# Patient Record
Sex: Female | Born: 1945
Health system: Western US, Academic
[De-identification: ages and names within clinical notes are randomized; demographics above are authoritative.]

---

## 2022-11-04 ENCOUNTER — Ambulatory Visit: Payer: Commercial Managed Care - Pharmacy Benefit Manager

## 2022-11-04 ENCOUNTER — Inpatient Hospital Stay: Disposition: A | Discharge status: Home / Self Care | Payer: PRIVATE HEALTH INSURANCE

## 2022-11-04 DIAGNOSIS — G9389 Other specified disorders of brain: Secondary | ICD-10-CM

## 2022-11-04 DIAGNOSIS — C712 Malignant neoplasm of temporal lobe: Secondary | ICD-10-CM

## 2022-11-04 DIAGNOSIS — G935 Compression of brain: Secondary | ICD-10-CM

## 2022-11-04 LAB — Comprehensive Metabolic Panel
ANION GAP: 11 mmol/L (ref 8–19)
BILIRUBIN,TOTAL: 0.4 mg/dL (ref 0.1–1.2)
BILIRUBIN,TOTAL: 0.4 mg/dL (ref 0.1–1.2)

## 2022-11-04 LAB — C-Reactive Protein: C-REACTIVE PROTEIN: <0.3 mg/dL (ref <0.8)

## 2022-11-04 LAB — Blood Lactate: BLOOD LACTATE: 25 mg/dL — ABNORMAL HIGH (ref 5–18)

## 2022-11-04 LAB — Procalcitonin: PROCALCITONIN: <0.1 ug/L (ref <0.10)

## 2022-11-04 LAB — APTT: APTT: <24 s — ABNORMAL LOW (ref 24.4–36.2)

## 2022-11-04 LAB — CBC: RED CELL DISTRIBUTION WIDTH-SD: 39.8 fL (ref 36.9–48.3)

## 2022-11-04 LAB — Free T4: FREE T4: 1.5 ng/dL (ref 0.80–1.70)

## 2022-11-04 LAB — TSH with reflex FT4, FT3: TSH: 0.17 u[IU]/mL — ABNORMAL LOW (ref 0.3–4.7)

## 2022-11-04 LAB — Sedimentation Rate, Erythrocyte: SEDIMENTATION RATE, ERYTHROCYTE: 6 mm/h (ref <=25)

## 2022-11-04 LAB — Prothrombin Time Panel: INR: 1.1 s (ref 11.5–14.4)

## 2022-11-04 LAB — Magnesium: MAGNESIUM: 1.9 meq/L (ref 1.4–1.9)

## 2022-11-04 LAB — Hgb A1c: HGB A1C: 5.7 — ABNORMAL HIGH (ref <5.7)

## 2022-11-04 LAB — Calcium,Ionized: IONIZED CA++,CORRECTED: 1.3 mmol/L — ABNORMAL HIGH (ref 1.09–1.29)

## 2022-11-04 LAB — Phosphorus: PHOSPHORUS: 2.6 mg/dL (ref 2.3–4.4)

## 2022-11-04 MED ORDER — PANTOPRAZOLE SODIUM 40 MG PO TBEC
40 mg | ORAL_TABLET | Freq: Every day | ORAL | 0 refills | 30.00000 days | Status: AC
Start: 2022-11-04 — End: 2022-11-18
  Filled 2022-11-18 (×2): qty 30, 30d supply, fill #0

## 2022-11-04 MED ORDER — LEVETIRACETAM 1000 MG PO TABS
1000 mg | ORAL_TABLET | Freq: Two times a day (BID) | ORAL | 0 refills | 30.00 days | Status: AC
Start: 2022-11-04 — End: 2022-11-18
  Filled 2022-11-18: qty 60, 30d supply, fill #0

## 2022-11-04 MED ORDER — DEXAMETHASONE 4 MG PO TABS
4 mg | ORAL_TABLET | Freq: Three times a day (TID) | ORAL | 0 refills | 30.00000 days | Status: AC
Start: 2022-11-04 — End: 2022-11-19
  Filled 2022-11-18: qty 90, 30d supply, fill #0

## 2022-11-04 MED ADMIN — SODIUM CHLORIDE 0.9 % IV SOLN: 75 mL/h | INTRAVENOUS | @ 11:00:00 | Stop: 2022-12-04 | NDC 00338004904

## 2022-11-04 MED ADMIN — PROPRANOLOL HCL 10 MG PO TABS: 10 mg | ORAL | @ 22:00:00 | Stop: 2022-12-04

## 2022-11-04 MED ADMIN — LEVETIRACETAM 1000 MG/100ML RTU: 1000 mg | INTRAVENOUS | @ 16:00:00 | Stop: 2022-12-04 | NDC 44567050201

## 2022-11-04 MED ADMIN — NICARDIPINE 50MG/250 ML DRIP (PERIPHERAL)(TITRATABLE/ICU): 515 mg/h | INTRAVENOUS | @ 11:00:00 | Stop: 2022-11-05

## 2022-11-04 MED ADMIN — DOCUSATE SODIUM 100 MG PO CAPS: 100 mg | ORAL | @ 16:00:00 | Stop: 2022-11-09 | NDC 00904718361

## 2022-11-04 MED ADMIN — DEXAMETHASONE 4 MG PO TABS: 4 mg | ORAL | @ 16:00:00 | Stop: 2022-11-10 | NDC 66993073051

## 2022-11-04 NOTE — Consults
Pharmaceutical Services - Admission Medication Reconciliation Note      Patient Name: [name]  Medical Record Number: [id number]  Admit date: 11/04/2022 2:45 AM    Age: 77 y.o.  Sex: female  Allergies: No Known Allergies  Height:   Most recent documented height   11/04/22 1.6 m (5' 3'')     Actual Weight:   Most recent documented weight   11/04/22 64.5 kg     Diagnosis: The patient is currently admitted with the following concerns/issues: Principal Problem:    Brain mass (POA: Yes)      Reported Medication History   I spoke with the patient at bedside and used Sures[employer]s (outpatient Rx fill history database) to update the home medication list for this hospital admission.    Patient transferred from OSH [facility], admitted 3.20.2024)    Surescripts pharmacy records show these medications recently filled in the past 3 months but the patient was not taking them prior to this hospital admission:   -Hydrocodone-Acetaminophen 10-325 mg filled 2.28.2024 qty 14 for 7 day supply. Patient confirms not taking at home, doesn't require    Consult question specifically stated ''Some beta blocker reportedly'' - however pt repeatedly denied taking any beta-blockers, and no fills exist in the last 6 months on Surescripts for any beta-blockers.    PTA Medication List (discrepancies are noted)   Medications Prior to Admission   Medication Sig Notes    amLODIPine 10 mg tablet Take 1 tablet (10 mg total) by mouth daily. Currently not ordered inpatient - nicardipine gtt ordered instead      Ascorbic Acid (VITAMIN C) 1000 MG tablet Take 1 tablet (1,000 mg total) by mouth daily. Currently not ordered inpatient      Cholecalciferol (VITAMIN D-3) 125 mcg (5000 units) TABS Take 1 tablet (125 mcg total) by mouth daily. Currently not ordered inpatient      estradiol (ESTRACE) 0.5 mg tablet Take 1 tablet (0.5 mg total) by mouth daily. Currently not ordered inpatient - assess if/when clinically appropriate to restart Glucosamine HCl (GLUCOSAMINE PO) Take 1 capsule by mouth daily. Currently not ordered inpatient      medroxyPROGESTERone 5 mg tablet Take 1 tablet (5 mg total) by mouth daily. Currently not ordered inpatient - assess if/when clinically appropriate to restart      METAMUCIL FIBER PO Mix 1 tablespoon in 8 oz of liquid, and take by mouth daily Currently not ordered inpatient & nonformulary; please reassess clinically      naproxen 250 mg tablet Take 1 tablet (250 mg total) by mouth daily as needed (pain). Filled 2.20.2024 qty 180 for 90 day supply.  Patient confirms 1 tablet every day PRN at home.  Currently not ordered inpatient      omeprazole 20 mg DR capsule Take 1 capsule (20 mg total) by mouth daily. Currently not ordered inpatient & nonformulary; please reassess clinically (may consider pantoprazole)      vitamin E 180 mg (400 units) capsule Take 1 capsule (180 mg total) by mouth daily. Currently not ordered inpatient         Discharge Prescription Preference: No Pharmacies Listed    The patient's allergies and medications have been reviewed and updated.     Lin Landsman, 11/04/2022, 11:25 AM  Medication Reconciliation Pharmacy Technician     ___________________________________________________________________________________________________________________    I reviewed the prior to admission medication list compiled by the medication reconciliation pharmacy technician and attest to the above.     Beers  criteria reviewed with the patient's outpatient medication list.     The reconciliation of admission orders with the prior to admission medication list is complete.     Notified team regarding the updates above via SPOK pager.          Jonette Eva PharmD, BCPS 11/04/2022 11:35 AM   Medication Reconciliation Pharmacist    Please note, accuracy is based on the patient?s/family's/caregiver's ability and willingness to provide this information at the time of interview. This progress note represents our good faith effort to obtain the best possible medication history from all attainable sources at the time of reconciliation.

## 2022-11-04 NOTE — Discharge Summary
NEUROSURGERY DISCHARGE SUMMARY      PATIENT:  [name]   MRN:  [id number]  DOB:  1945/11/08    Attending Physician: Constance Haw., MD  Primary Care Physician:  Aviva Kluver, MD    Admission Date:  11/04/2022  Discharge Date:  11/04/2022    Discharge Diagnosis: Brain mass [G93.89]    Surgery Performed: * No surgery found *    HPI:  [name] is a 77 y.o. female not on AC/AP who presents with 2 weeks of progressive speech difficulty that she describes as 'the words coming out wrong' as well as lethargy. Language problems are equally present when speaking in [place] (first language), [place], and [place]. Denies any memory problems other than what she considers ''normal aging''. Pt reports that she is otherwise in her usual state of health and denies any fevers, chills, seizures, confusion, receptive language difficulty, sensory deficits, or gross motor deficits. Otherwise, she is very active and functional at baseline. 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 She has no known hx of systemic cancers.    Brief Hospital Course: [name] is a 77 y.o. female with Brain mass [G93.89]     3/29: MR wwo c/w R ant temporal tumor, ESR/CRP/procal wnl, lactate elevated, afebrile, no other s/s infxn, low c/f infxn per ICU/neurorads    By day of discharge, the patient had a stable neurologic exam, was breathing easily on room air, had return of bowel function, was tolerating a diet, and had adequate pain control with oral medication. The patient was deemed stable for discharge.      PHYSICAL EXAM:    Vital Signs   Temp:  [36.2 ?C (97.2 ?F)-36.5 ?C (97.7 ?F)] 36.5 ?C (97.7 ?F)  Heart Rate:  [46-60] 59  Resp:  [13-22] 15  BP: (97-150)/(50-85) 140/85  NBP Mean:  [71-101] 101  SpO2:  [93 %-99 %] 96 %  On RA    AAOx3  Following commands x4.  +comprehension, impaired fluency, paraphasic errors, speech latency/extra effort; impaired repetition; naming 2/3; immediate recall 3/3, delayed recall 1/3   CN 2-12 intact  Motor: 5/5 throughout.  No pronator drift.  Reflexes: 2+, symmetric  Sensation grossly intact to light touch    Chest: symmetrical chest rise, CTA in all lobes, NSR, (-)murmur  Vascular: 2+ pulses throughout (-)edema, cyanosis  ABD: soft, nontender, flat, normoactive bowel sounds    Lines and Drains  CENTRAL LINE: None  DRAINS: None  FOLEY: None    Pertinent Imaging  MR OSH:   IMPRESSION:  Heterogeneously enhancing centrally restricting and necrotic mass located in the left anterior mid temporal lobes with moderate perilesional vasogenic edema, regional mass effect and mild midline shift from left to right at the level of septum   pellucidum. 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 Laymond Purser after discharge    Respiratory  No active issues    Cardiovascular  No active issues    GU / Renal  No active issues    ID  No active issues    Endocrine  No active issues    GI / Hepatic  No active  issues  Diet: Regular  GI ppx: pantoprazole 40 qday while on dex    Disposition:   Home    Discharge Condition: Stable    Diet: Regular      Discharge Instructions: Detailed Neurosurgery discharge instructions given and discussed with patient prior to discharge.    Post-Discharge Appointments:   The patient was asked to follow up with neurosurgery for a postoperative visit. Details, including time of postoperative visit, office location, and office phone number was given to the patient directly.          NEUROSURGERY DISCHARGE INSTRUCTIONS:  Please, review the following discharge instructions. They will answer many common questions patients have after surgery.    DIET: You may resume the type of diet you had before surgery, unless changed by your doctor during your stay at the hospital. Eating a well-balanced diet is important for proper wound healing.    MEDICATIONS:     Medication List        START taking these medications      dexAMETHasone 4 mg tablet  Take 1 tablet (4 mg total) by mouth three (3) times daily.     levETIRAcetam 1000 mg tablet  Commonly known as: Keppra  Take 1 tablet (1,000 mg total) by mouth two (2) times daily.     pantoprazole 40 mg DR tablet  Commonly known as: Protonix  Take 1 tablet (40 mg total) by mouth daily.            CONTINUE taking these medications      amLODIPine 10 mg tablet  Commonly known as: Norvasc     estradiol 0.5 mg tablet  Commonly known as: Estrace     GLUCOSAMINE PO     medroxyPROGESTERone 5 mg tablet  Commonly known as: Provera     METAMUCIL FIBER PO     naproxen 250 mg tablet  Commonly known as: Naprosyn     omeprazole 20 mg DR capsule  Commonly known as: PriLOSEC     vitamin C 1000 MG tablet     Vitamin D-3 125 mcg (5000 units) Tabs     vitamin E 180 mg (400 units) capsule               Where to Get Your Medications        These medications were sent to [facility] [phone])  [address], [place] [address]      Hours: Mon-Fri 8AM-9PM, Sat 8AM-7PM, Sun/Holidays 8AM-5PM (Closed 1PM-2PM for lunch) Phone: [phone]   dexAMETHasone 4 mg tablet  levETIRAcetam 1000 mg tablet       Information about where to get these medications is not yet available    Ask your nurse or doctor about these medications  pantoprazole 40 mg DR tablet       Start taking the medications you took prior to surgery unless directed otherwise by your physician. Should you have questions about this please ask your physician prior to discharge.    Your doctor will provide you with prescriptions for the medication you are to take at home. You may fill your prescriptions at the [facility], or you can have them filled at a pharmacy closer to your home.    Before your discharge, your nurse will review with you and write down your medication dosage, schedule, and side effects. It is important to take your medications as ordered and try to stay on schedule.    Do NOT  take ibuprofen, aspirin, coumadin, xarelto or any other anti-platelet or anti-coagulation medication unless instructed to do so by your surgeon.    If you are unsure if a medication is safe, ask your physician and/or pharmacist.    SIGNS TO WATCH FOR AT HOME:  Always try to call your primary care doctor first if you are experiencing any of the symptoms below:  - Onset of severe, persistent headache not relieved by medication and rest.  - Onset of increased drowsiness, confusion, stiff neck, or nausea and vomiting.  - Any new onset or worsening of visual problems.  - Any new onset or worsening of speech problems.  - Any new onset or worsening of swallowing problems.  - Any new onset of or increased weakness, numbness or tingling.  - Any new onset of or increased seizures.  - New weakness or sensory changes  - Fever>101o F  - Shortness of breath  - Chest pain  - Calf/leg pain    For life-threatening emergencies that cannot wait, please go to the nearest Emergency Room for immediate evaluation or dial 911.    HOW TO REACH [place]:  We are available to you at all times.     During business hours, please call [facility]: [phone].     After 5:00 PM, please call the [facility] page operator: [phone]. Ask to have the neurosurgical resident on call contacted for urgent questions.    REHABILITATION NEEDS:  If indicated, our rehabilitation professional will assess you prior to your discharge. We will order any rehabilitation needs and equipment prior to your discharge.    FOLLOW-UP APPOINTMENTS:    Follow up with your primary care physician in 1 week for any health maintenance needs.'  Please follow-up with Dr. Emeline Darling, please call [phone] to schedule your appointment  Darlene Dezelan, your patient navigator, will contact you after you discharge to arrange oncological follow up. She can be reached at [phone] with any questions or concerns.        I spent >30 minutes examining the patient, preparing discharge documents including: discharge records, prescriptions, referral forms, instructions, and communicating discharge plan with the patient, family, and all relevant care providers.    The patient was seen and examined by the Neurosurgery team. Above plan was discussed with neurosurgery resident team and attending physician who were in agreement with the plan.    Author: Rhea Belton, MD  11/04/2022 4:20 PM  Department of Neurosurgery

## 2022-11-04 NOTE — Consults
NEUROSURGERY CONSULT NOTE     CHIEF COMPLAINT: speech difficulty    HISTORY OF PRESENT ILLNESS   [name] is a 77 y.o. year old female not on AC/AP who presents with 2 weeks of progressive speech difficulty that she describes as 'the words coming out wrong' as well as lethargy. Language problems are equally present when speaking in [place] (first language), [place] and [place]. Denies any memory problems other than what she considers normal aging. Pt reports that she is otherwise in her usual state of health and denies any seizures, confusion, receptive language difficulty, sensory deficits, or gross motor deficits. Otherwise, she is very active and functional at baseline. 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 She has no known hx of systemic cancers.    PAST MEDICAL/SURGICAL HISTORY   No past medical history on file.  No past surgical history on file.    MEDICATIONS      dexAMETHasone  4 mg Oral Q8H    docusate  100 mg Oral BID    levETIRAcetam  1,000 mg Intravenous Q12H       ALLERGIES   Patient has no known allergies.    FAMILY HISTORY / SOCIAL HISTORY   No family history on file.   has no history on file for tobacco use, alcohol use, and drug use.    REVIEW OF SYSTEMS   A complete 14-point review of systems was conducted. Pertinent positives and negatives are noted in the HPI. Systems reviewed include constitutional symptoms, eyes, ears, nose, throat, mouth, cardiovascular, respiratory, gastrointestinal, genitourinary, musculoskeletal, integumentary (skin and breasts), neurological, psychiatric, endocrine, hematologic/lymphatic, and allergic/immunologic.     PHYSICAL EXAM   Temp:  [36.4 ?C (97.5 ?F)] 36.4 ?C (97.5 ?F)  Heart Rate:  [55] 55  Resp:  [22] 22  BP: (143)/(74) 143/74  NBP Mean:  [94] 94  SpO2:  [97 %] 97 %  AAOx3  Following commands x4.  +comprehension + some impaired fluency, making paraphasic errors and with some speech latency/extra effort; impaired repetition; naming 2/3; immediate recall 3/3, delayed recall 1/3   Pupils equal, round, and reactive to light. Visual fields full to confrontation, Extraocular eye movements full. Normal facial expression and sensation. Hearing intact bilaterally; Palate elevates equally bilaterally. Sternocleidomastoid and shoulder shrug movements are normal bilaterally. Tongue midline.  Motor: 5/5 throughout.  No pronator drift.  Reflexes: 2+, symmetric  Sensation grossly intact to light touch    DATA     Recent Labs   Lab 11/04/22  0339   WBC 32.12*   PLT 392   INR 1.1   APTT <24.0*   NA 134*   CREAT 0.61   GLUCOSE 126*        MRI from OSH: contrast enhancing anterior temporal lobe mass      ASSESSMENT / PLAN   [name] is a 77 y.o. female  with new ring enhancing high grade lesion in left temporal lobe and associated language difficulty c/f HGG    Plan:   [ ]  fMRI for presurgical planning. Okay for outpatient fMRI.   [ ]  MRI brain lab protocol including DWI/ADC and DTI  - Dexamethasone 4mg  TID  - Keppra for seizure ppx  - ESR / CRP in setting of leukocytosis; infectious workup      The patient was discussed with the resident team & attending who agree with assessment and plan listed above. Please page [phone] with any questions.    PRESENT ON ADMISSION:  Htn  No past medical history on file.    GOC: Patient interested in pursuing all surgical and medical interventions      Jeanette Debes C. Banks Chaikin, MD  6:02 AM 11/04/2022  Department of Neurological Surgery  [phone]

## 2022-11-04 NOTE — H&P
Neurocritical Care Admission Note    The patient was seen in the ICU, is unstable and 30 minutes critical care time was spent by me    The patient is unstable today 11/04/2022 and has a high probability of imminent or life threatening deterioration due to brain mass, brain compression, cerebral edema and requires critical care to treat these medical issues, the inherent instabilility that these issues cause, and to prevent further neurologic and/or systemic deterioration, harm, potential for worsening instability, poor neurologic outcome and/or death.      Patient:[name]  AVW:[id number]  Age:77 y.o.  Length of Stay: 0  Date of Service: 11/04/2022    Present on Admission:   Brain mass      Patient Active Problem List   Diagnosis    Brain mass     No past medical history on file.       Admission History:  77 y.o. female with HTN, chronic back pain who presented to OSH and was found to have L temporal mass c/f glioma, transferred to [facility] for HLOC.   Patient reports word finding difficulty and mild RUE weakness for several weeks, prompting presentation to the ED. Imaging showed 4.9cm L anterior temporal lesion which was concerning for glioma on MRI. Transferred to [facility] for operative planning.   ICU Course:  3/29: transfer to NCCU  Vital Signs & Labs reviewed on EMR Flow Sheets:  Last 24 hours vitals  Temp:  [36.4 ?C (97.5 ?F)] 36.4 ?C (97.5 ?F)  Heart Rate:  [55] 55  Resp:  [22] 22  BP: (143)/(74) 143/74  NBP Mean:  [94] 94  SpO2:  [97 %] 97 %  I & O's Last 24 hours totals: No intake or output data in the 24 hours ending 11/04/22 0340  I&O's Last 24 hours components:No intake/output data recorded.  Labs last 24 hours (labs are drawn just before midnight on the previous calendar day by ICU protocol  :No results found for: ''WBC'', ''HGB'', ''HCT'', ''PLT'', ''NA'', ''K'', ''CL'', ''BUN'', ''CO2''      MEDICATIONS:   docusate  100 mg Oral BID    levETIRAcetam  1,000 mg Intravenous Q12H       Physical Examination:    GCS:15 [E/V/M scores are  4/5/6   ]  General Systems Examination: Systems examined as clinically indicated: Head, Neck, Pulmonary, cardiac/circulation, Abdomen, Skin, and Volume status.     Neurological examination:  Cognition/Attention/Alertness: Alertness, eye opening, arousal, attention, tracking, language function, memory, and orientation were assessed as indicated/feasible:  AOx4, naming/repetition intact, following complex commands    CNs 2-12: Pupils, Visual Fields, EOMs, facial expression, Tongue/palate/brainstem reflexes, articulation:  Pupils: 3->2  EOMs: intact  Face: symmetric at rest and with excursion  Tongue/Palate: midline  Articulation: no dysarthria    Motor scores were: (out of 5)  RUE and RLE:     5/5  LUE and LLE:      5/5  DTRs and/or plantar reflexes were assessed as indicated/feasible: toes down     Assessment, Diagnoses and Critical Care Treatment Plan for today 11/04/2022  CRITICAL CARE DELIVERY today 11/04/2022. Today I designed and supervised treatment for brain mass, brain compression, cerebral edema  to optimize the patient's care in order to protect the brain and other organ systems from additional injury. This consisted of evaluating and adjusting multiple treatments by organ system, as outlined below in the systems section, for specific interventions that are new and/or evolving. 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   Assessment of DDX and treatment options for the neurologic working diagnosis. Frequent neurochecks by RNs, including specific neurologic monitoring. Implementing standardized protocols/pathways for the main neurologic disease state. Use of antiseizure drugs (ASD) as indicated.  Discussion:   4.9cm heterogeneously enhancing L anterior temporal mass with mild associated mass effect.   Upload OSH images  MRI brain lab  Dex taper per NSGY  LEV 500mg  BID    Respiratory:   Diagnosis:None    Treatment Plan:Standard ICU care.  Discussion:The patient has risk for aspiration PNA given the neurologic injury and poor airway reflexes. Close monitoring of the airway and SpO2. Incentive spirometry and DVTP to prevent ICU complications of atelectasis and pulmonary embolism. HOB up 30 degrees unless unable due to patient safety. Assessing needs for mechanical ventilation.    Incentive spirometry    Cardiovascular:  Diagnosis:Hypertension (401.10)  Treatment Plan:Standard ICU care.  Testing and Monitoring: review of serial BP, HR and EKG values, with review of hemodynamic monitoring in order to determine optimal cerebral and systemic perfusion. Evaluate for cardiac arrythmias and cardiac ischemia.    Discussion:    Goal for Blood Pressure: SBP 90-160    Renal:  Diagnosis:Euvolemic  Testing: Recent 24 hour renal labs:No results found for: ''NA'', ''K'', ''BUN'', ''CO2''  No intake or output data in the 24 hours ending 11/04/22 0340    Treatment Plan:Maintenance IV fluids to maintain euvolemia.    Discussion:   Maintenance IV fluids being administered: NS at 75 cc/hr  Goal for fluid balance: EVEN  Goal for Na/Osmolarity: 140-150  Monitoring fluid balance and electrolytes and replacing electrolytes as needed. Serial Na checks when using osmolar Tx.    Hematopoietic:  Diagnosis:Anemia and coagulation assessed (286.9)  Recent 24 hours Heme labs: No results found for: ''WBC'', ''HGB'', ''HCT'', ''PLT''  Treatment Plan:Standard ICU care.    Discussion:   Monitoring for coagulopathy to prevent intracranial or systemic bleeding.   Goal for Hb: > 7  SCDs    Infectious Disease:  Diagnosis:   Treatment Plan:Standard ICU care  TTM for fever prevention.  Testing:   Most recent WBC labs No results found for: ''WBC''  Lab Cx results were reviewed and are outlined below:    Discussion: no ATB    Endocrine:  Diagnosis:Acute hyperglycemia (790.6)  Testing:Unit based POC testing q 6 hours and lab glucose checks  Most recent glucose point of care values No results found for: ''GLUCOSEPOC''      Treatment Plan: Standard ICU protocol., Monitoring glucose checks frequently and monitoring intermittent lab glucose values. Glycemic control protocol using SSI and insulin drip according to goal., Goals blood glucose 100 to 150 mg/dl or adjusted per clinical condition.     Discussion:   Glucose point of care testing reviewed today 11/04/2022   Insulin sliding scale    GI/Hepatic:  Diagnosis: Dysphagia was not suspected  Testing: Swallow testing if able.  Treatment Plan:Standard ICU care.  Assess feeding access, monitoring for aspiration and tolerance of feeds, performing bowel movement promotion protocol and nutritional consultation  Discussion:  Nutritional performance was reviewed today 11/04/2022    NPO    Skin: Reviewed skin status with the bedside nurse, and assessed for skin turgor, decubiti, lesions and rash.    Lines and Access:  Patient Lines/Drains/Airways Status       Active Lines:       Name Placement date Placement time Site Days    Peripheral IV 20 G Anterior;Left;Proximal Antecubital 11/01/22  --  Antecubital  3                      Family and/or Surrogate:    Family Discussion/ Documentation:  The care plan for each section of this note was reviewed and updated as needed on this day. Critical care was delivered in variable time segments throughout the day and reported as an aggregate time using a Neurocritical Care Physician 24-hour staffing protocol. Our multidisciplinary team rounds were conducted at the bedside and examined the patient. I have coordinated care with multiple teams and consultants, and reviewed the medical records, consult notes, ICU flow sheets, cardiac monitoring/EKG waveforms, mechanical ventilation, medication record, radiological studies and serial sequential labs.   INFORMED CONSENT: the patient and/or surrogate have been informed of the diagnoses and treatment plan including risks, benefits and alternatives of all treatments, and procedures, including robotic telepresence, by me and/or our physician and/or APP team members.   MEDICAL DECISION MAKING: this is a highly complex case given the critical illness of multiple organ systems that affect the nervous system and create a risk of stroke or neurological injury.

## 2022-11-04 NOTE — H&P
Neurocritical Care Admission Note    The patient was seen in the ICU      Patient:[name]  ZOX:[id number]  Age:77 y.o.  Length of Stay: 0  Date of Service: 11/04/2022    Present on Admission:   Brain mass      Patient Active Problem List   Diagnosis    Brain mass     No past medical history on file.       Admission History:  77 y.o. female with HTN, chronic back pain who presented to OSH and was found to have L temporal mass c/f glioma, transferred to [facility] for HLOC.   Patient reports word finding difficulty and mild RUE weakness for several weeks, prompting presentation to the ED. Imaging showed 4.9cm L anterior temporal lesion which was concerning for glioma on MRI. Transferred to [facility] for operative planning.   ICU Course:  3/29: transfer to NCCU, good exam. No HA.   Vital Signs & Labs reviewed on EMR Flow Sheets:  Last 24 hours vitals  Temp:  [36.2 ?C (97.2 ?F)-36.4 ?C (97.5 ?F)] 36.2 ?C (97.2 ?F)  Heart Rate:  [46-60] 60  Resp:  [14-22] 15  BP: (97-150)/(50-74) 150/66  NBP Mean:  [71-94] 88  SpO2:  [94 %-98 %] 98 %  I & O's Last 24 hours totals:   Intake/Output Summary (Last 24 hours) at 11/04/2022 0955  Last data filed at 11/04/2022 0900  Gross per 24 hour   Intake 411.25 ml   Output 250 ml   Net 161.25 ml     I&O's Last 24 hours components:I/O last 3 completed shifts:  In: 261.3 [I.V.:261.3]  Out: 250 [Urine:250]  Labs last 24 hours (labs are drawn just before midnight on the previous calendar day by ICU protocol  :  Lab Results   Component Value Date    WBC 32.12 (H) 11/04/2022    HGB 14.1 11/04/2022    HCT 41.9 11/04/2022    PLT 392 11/04/2022    NA 134 (L) 11/04/2022    K 4.5 11/04/2022    CL 100 11/04/2022    BUN 32 (H) 11/04/2022    CO2 23 11/04/2022         MEDICATIONS:   dexAMETHasone  4 mg Oral Q8H    docusate  100 mg Oral BID    levETIRAcetam  1,000 mg Intravenous Q12H       Physical Examination:    GCS:15       [E/V/M scores are  4/5/6   ]  General Systems Examination: Systems examined as clinically indicated: Head, Neck, Pulmonary, cardiac/circulation, Abdomen, Skin, and Volume status.     Neurological examination:  Cognition/Attention/Alertness: Alertness, eye opening, arousal, attention, tracking, language function, memory, and orientation were assessed as indicated/feasible:  AOx4, naming/repetition intact, following complex commands  Mild repetition incompleteness on language exam, naming well, calcs are good.    CNs 2-12: Pupils, Visual Fields, EOMs, facial expression, Tongue/palate/brainstem reflexes, articulation:  Pupils: 3->2  EOMs: intact  Face: symmetric at rest and with excursion  Tongue/Palate: midline  Articulation: no dysarthria    Motor scores were: (out of 5)  RUE and RLE:     5/5  LUE and LLE:      5/5  DTRs and/or plantar reflexes were assessed as indicated/feasible: 2+ and both toes down     Assessment, Diagnoses and Critical Care Treatment Plan for today 11/04/2022  High complecity given brain tumor and Mild APHASIA  :  [x] Managed intracranial  hemodynamics, CSF dynamics and CBF    [x] Hemodynamic and cardiac management    [x] Reviewed radiological images to guide critical care  [x] Neuroprognostication  [x] Coordination of consultant teams to form a cohesive treatment plan    Neurologic:  Diagnosis: Tumor- 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   Assessment of DDX and treatment options for the neurologic working diagnosis. Frequent neurochecks by RNs, including specific neurologic monitoring. Implementing standardized protocols/pathways for the main neurologic disease state. Use of antiseizure drugs (ASD) as indicated.  Discussion:   4.9cm heterogeneously enhancing L anterior temporal mass with mild associated mass effect.   MRI brain concerning for malignant brain tumor GBM with necrosis and cystic components.  Dex taper per NSGY  Would consider stopping LEV if no surgery is offered.      Respiratory:   Diagnosis:None    Treatment Plan:Standard ICU care.  Discussion:The patient has risk for aspiration PNA given the neurologic injury and poor airway reflexes. Close monitoring of the airway and SpO2. Incentive spirometry and DVTP to prevent ICU complications of atelectasis and pulmonary embolism. HOB up 30 degrees unless unable due to patient safety. Assessing needs for mechanical ventilation.    Incentive spirometry    Cardiovascular:  Diagnosis:Hypertension (401.10)  Treatment Plan:Standard ICU care.  Testing and Monitoring: review of serial BP, HR and EKG values, with review of hemodynamic monitoring in order to determine optimal cerebral and systemic perfusion. Evaluate for cardiac arrythmias and cardiac ischemia.    Discussion:    Goal for Blood Pressure: SBP 90-160  EKG : no ST segment changes    Hx of HTN and is on a beta bliocker by report , not certain.  Inderal 10 mg tid starting  TTE      Renal:  Diagnosis:Euvolemic  Testing: Recent 24 hour renal labs:  Lab Results   Component Value Date    NA 134 (L) 11/04/2022    K 4.5 11/04/2022    BUN 32 (H) 11/04/2022    CO2 23 11/04/2022       Intake/Output Summary (Last 24 hours) at 11/04/2022 0955  Last data filed at 11/04/2022 0900  Gross per 24 hour   Intake 411.25 ml   Output 250 ml   Net 161.25 ml       Treatment Plan:Maintenance IV fluids to maintain euvolemia.    Discussion:   Maintenance IV fluids being administered: NS at 75 cc/hr  Goal for fluid balance: EVEN  Goal for Na/Osmolarity: 140-150  Monitoring fluid balance and electrolytes and replacing electrolytes as needed. Serial Na checks when using osmolar Tx.    Hematopoietic:  Diagnosis:Anemia and coagulation assessed (286.9)  Recent 24 hours Heme labs:   Lab Results   Component Value Date    WBC 32.12 (H) 11/04/2022    HGB 14.1 11/04/2022    HCT 41.9 11/04/2022    PLT 392 11/04/2022     Treatment Plan:Standard ICU care.    Discussion:   Monitoring for coagulopathy to prevent intracranial or systemic bleeding.   Goal for Hb: > 7  SCDs    Infectious Disease:  Diagnosis:   Treatment Plan:Standard ICU care  TTM for fever prevention.  Testing:   Most recent WBC labs   Lab Results   Component Value Date    WBC 32.12 (H) 11/04/2022     Lab Cx results were reviewed and are outlined below:    Discussion: no ATB    Endocrine:  Diagnosis:Acute hyperglycemia (790.6)  Testing:Unit based POC testing q  6 hours and lab glucose checks  Most recent glucose point of care values No results found for: ''GLUCOSEPOC''      Treatment Plan: Standard ICU protocol., Monitoring glucose checks frequently and monitoring intermittent lab glucose values. Glycemic control protocol using SSI and insulin drip according to goal., Goals blood glucose 100 to 150 mg/dl or adjusted per clinical condition.     Discussion:   Glucose point of care testing reviewed today 11/04/2022   Insulin sliding scale    GI/Hepatic:  Diagnosis: Dysphagia was not suspected  Testing: Swallow testing if able.  Treatment Plan:Standard ICU care.  Assess feeding access, monitoring for aspiration and tolerance of feeds, performing bowel movement promotion protocol and nutritional consultation  Discussion:   Nutritional performance was reviewed today 11/04/2022    NPO    Skin: Reviewed skin status with the bedside nurse, and assessed for skin turgor, decubiti, lesions and rash.    Lines and Access:  Patient Lines/Drains/Airways Status       Active Lines:       Name Placement date Placement time Site Days    Peripheral IV 20 G Anterior;Left;Proximal Antecubital 11/01/22  --  Antecubital  3                      Family and/or Surrogate:  Daughter is [facility]  Family Discussion/ Documentation:  The care plan for each section of this note was reviewed and updated as needed on this day. Critical care was delivered in variable time segments throughout the day and reported as an aggregate time using a Neurocritical Care Physician 24-hour staffing protocol. Our multidisciplinary team rounds were conducted at the bedside and examined the patient. I have coordinated care with multiple teams and consultants, and reviewed the medical records, consult notes, ICU flow sheets, cardiac monitoring/EKG waveforms, mechanical ventilation, medication record, radiological studies and serial sequential labs.   INFORMED CONSENT: the patient and/or surrogate have been informed of the diagnoses and treatment plan including risks, benefits and alternatives of all treatments, and procedures, including robotic telepresence, by me and/or our physician and/or APP team members.   MEDICAL DECISION MAKING: this is a highly complex case given the critical illness of multiple organ systems that affect the nervous system and create a risk of stroke or neurological injury.

## 2022-11-04 NOTE — Progress Notes
Neurosurgery Progress Note   LOS: 0 days    Neurosurgery Attending: Katty Chambers is a 77 y.o. year old female not on AC/AP who presents with 2 weeks of progressive speech difficulty that she describes as 'the words coming out wrong' as well as lethargy. Language problems are equally present when speaking in [place] (first language), [place]., and [place]. Denies any memory problems other than what she considers normal aging. Pt reports that she is otherwise in her usual state of health and denies any seizures, confusion, receptive language difficulty, sensory deficits, or gross motor deficits. Otherwise, she is very active and functional at baseline. 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 She has no known hx of systemic cancers.        Exam  Temp:  [36.4 ?C (97.5 ?F)] 36.4 ?C (97.5 ?F)  Heart Rate:  [55] 55  Resp:  [22] 22  BP: (143)/(74) 143/74  NBP Mean:  [94] 94  SpO2:  [97 %] 97 %  Neurologic Exam:  AAOx3  Following commands x4.  +comprehension + some impaired fluency, making paraphasic errors and with some speech latency/extra effort; impaired repetition; naming 2/3; immediate recall 3/3, delayed recall 1/3   Pupils equal, round, and reactive to light. Visual fields full to confrontation, Extraocular eye movements full. Normal facial expression and sensation. Hearing intact bilaterally; Palate elevates equally bilaterally. Sternocleidomastoid and shoulder shrug movements are normal bilaterally. Tongue midline.  Motor: 5/5 throughout.  No pronator drift.  Reflexes: 2+, symmetric  Sensation grossly intact to light touch     Drains:   Output by Drain (mL) 11/02/22 0701 - 11/03/22 0700 11/03/22 0701 - 11/04/22 0437   Patient has no LDAs of requested type attached.           Labs / Imaging  Recent Labs     11/04/22  0339   NA 134*   WBC 32.12*   HGB 14.1   PLT 392     Medications:  Steroids:  dexAMETHasone, 4 mg, Oral, Q8H      Anti-infectives:     Anti-epileptics & pain medications:  acetaminophen, 650 mg, Oral, Q6H PRN   Or  acetaminophen, 650 mg, Per NG Tube, Q6H PRN   Or  acetaminophen suppository, 650 mg, Rectal, Q6H PRN  levETIRAcetam, 1,000 mg, Intravenous, Q12H      Heme:       Imaging:           Assessment  [name]  Neuro ICU    [name] is a 77 y.o. 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    Plan:  [ ]  TTF 3/9 AM  [ ]  fMRI o/p for presurgical planning  [ ]  MRI brain lab protocol including DWI/ADC and DTI  - Will discuss metastatic workup  - Dexamethasone 4mg  TID to 2 BID until f/u  - Keppra for seizure ppx  - ESR/CRP/procal/lactate in setting of leukocytosis    Please page [phone] w/ questions

## 2022-11-04 NOTE — Other
Patient's Clinical Goal:   Clinical Goal(s) for the Shift: Stable VS, no pain or neuro deficits  Identify possible barriers to advancing the care plan:   Stability of the patient: Moderately Stable - low risk of patient condition declining or worsening   Progression of Patient's Clinical Goal: no neuro deficits at all. Pt alert and oriented, very pleasant. No pain at all.

## 2022-11-04 NOTE — Discharge Instructions
NEUROSURGERY DISCHARGE INSTRUCTIONS:  Please, review the following discharge instructions. They will answer many common questions patients have after surgery.     DIET: You may resume the type of diet you had before surgery, unless changed by your doctor during your stay at the hospital. Eating a well-balanced diet is important for proper wound healing.     DISCHARGE MEDICATIONS:     Medication List        START taking these medications      acetaminophen 325 mg tablet  Commonly known as: Tylenol  Take 2 tablets (650 mg total) by mouth every six (6) hours as needed for Pain.     bisacodyl 10 mg suppository  Commonly known as: Dulcolax  Place 1 suppository (10 mg total) rectally daily as needed for Constipation.     calcium carbonate 500 mg chewable tablet  Commonly known as: Tums  Chew 2 tablets (1,000 mg total) by mouth two (2) times daily Calcium Carbonate 500 mg is equivalent to 200 mg elemental Calcium.     dexAMETHasone 2 mg tablet  Take 2 tablets (4 mg total) by mouth three (3) times daily for 2 days, THEN 1 tablet (2 mg total) three (3) times daily for 2 days, THEN 1 tablet (2 mg total) two (2) times daily for 2 days, THEN 1 tablet (2 mg total) daily for 2 days.  Start taking on: November 18, 2022     docusate 100 mg capsule  Commonly known as: Colace  Take 1 capsule (100 mg total) by mouth two (2) times daily.     heparin 5000 unit/mL injection  Inject 1 mL (5,000 Units total) under the skin two (2) times daily.     hydrOXYzine 25 mg tablet  Take 1 tablet (25 mg total) by mouth every six (6) hours as needed for Anxiety.     levETIRAcetam 1000 mg tablet  Commonly known as: Keppra  Take 1 tablet (1,000 mg total) by mouth two (2) times daily.     melatonin 5 mg tablet  Take 1 tablet (5 mg total) by mouth at bedtime.     pantoprazole 40 mg DR tablet  Commonly known as: Protonix  Take 1 tablet (40 mg total) by mouth daily.     polyethylene glycol powder packet  Commonly known as: Glycolax  Take 1 packet (17 g total) by mouth daily.     sodium phosphate 7-19 g/118 mL ADULT enema  Place 1 enema rectally daily as needed.            CONTINUE taking these medications      amLODIPine 10 mg tablet  Commonly known as: Norvasc     estradiol 0.5 mg tablet  Commonly known as: Estrace     GLUCOSAMINE PO     medroxyPROGESTERone 5 mg tablet  Commonly known as: Provera     METAMUCIL FIBER PO     naproxen 250 mg tablet  Commonly known as: Naprosyn     omeprazole 20 mg DR capsule  Commonly known as: PriLOSEC     vitamin C 1000 MG tablet     Vitamin D-3 125 mcg (5000 units) Tabs     vitamin E 180 mg (400 units) capsule               Where to Get Your Medications        Information about where to get these medications is not yet available    Ask your nurse or doctor about these  medications  acetaminophen 325 mg tablet  bisacodyl 10 mg suppository  calcium carbonate 500 mg chewable tablet  dexAMETHasone 2 mg tablet  docusate 100 mg capsule  heparin 5000 unit/mL injection  hydrOXYzine 25 mg tablet  levETIRAcetam 1000 mg tablet  melatonin 5 mg tablet  pantoprazole 40 mg DR tablet  polyethylene glycol powder packet  sodium phosphate 7-19 g/118 mL ADULT enema     Start taking the medications you took prior to surgery unless directed otherwise by your physician. Should you have questions about this please ask your physician prior to discharge.     Your doctor will provide you with prescriptions for the medication you are to take at home. You may fill your prescriptions at the [facility], or you can have them filled at a pharmacy closer to your home.     Before your discharge, your nurse will review with you and write down your medication dosage, schedule, and side effects. It is important to take your medications as ordered and try to stay on schedule.     Do NOT take ibuprofen, aspirin, coumadin, xarelto or any other anti-platelet or anti-coagulation medication unless instructed to do so by your surgeon.     If you are unsure if a medication is safe, ask your physician and/or pharmacist.     COMFORT AND PAIN MANAGEMENT:  It is common to have a headache and/or pain after surgery. This may last a few days or a few weeks. You will have pain medications prescribed by your doctor for your pain management. The medication may be irritating to the stomach lining, it is advisable to take it with a teaspoon of applesauce or non-fat yogurt.     Pain medication (narcotics) may cause constipation. A high-fiber diet may help if this occurs. If your symptoms do not resolve, use a stool softener or over the counter gentle laxative. If the medications are ineffective, call your doctor?s office to discuss on-going pain management.     If you should become constipated there are many agents that you can obtain from any pharmacy over the counter. These include:              -For mild constipation: Colace, Senna, Milk of Magnesium, Miralax, Dulcolax tablet.                 -For moderate constipation: Milk of Magnesium, Dulcolax suppository                -For severe constipation: Please speak to your primary care physician.      You may also experience nightmares, hallucinations and sweating from your pain medications. Please let you doctor know if these symptoms occur.     Some medications such as Percocet, Vicodin and Norco contain Tylenol (acetaminophen). Avoid taking these medications more frequently then directed; too much Tylenol can cause liver damage. Do NOT take over-the-counter products that contain Tylenol/Acetaminophen if you are already on these medications.     DO NOT drive while taking narcotics!       Eye/facial swelling is common after surgery and may take a few days to a week to disappear. Bruising may occur and will take one to two weeks to resolve. You may feel better if you sleep with two pillows under your head; keeping your head elevated will help reduce facial swelling.     OVERVIEW OF DAILY ACTIVITIES:     You may feel more tired for 1-3 weeks after surgery. Get plenty of rest.  WALKING: Increase your activities slowly.  Start with a gradual walking program of 5-10 minutes 2-4 times a day.  Every few days, try to increase each session by a few minutes. You should walk every day. Walking will improve circulation, increase your feeling of well-being, and prevent pulmonary problems.     LIFTING:  Do not lift more than 10lbs for at least 2 weeks or until your surgeon tells you to do so. Do not participate in sports or activities that increase your risk for head injury such as contact sports, bike riding, soccer, football, skateboarding.     OTHER ACTIVITIES: Minimize activities that require you to bend your head downward (may increase headache) such as yoga, bending forward at the waist to pick up objects. Due to seizure risk, do not swim or hike alone. Avoid straining when having a bowel movement. Take laxatives as prescribed.     When you see your surgeon in the follow-up appointment, he or she will discuss decreasing the limits on activity at that time. You may resume sexual intimacy when you feel well enough, but do not overexert yourself. You must have clearance from your doctor before participating in any strenuous exercises/activity.     The brain will take roughly 2-6 weeks to heal. You may be very fatigued during this time. You may hear popping or dripping noises inside your head, but this is just a result of air and fluid reabsorbing after your surgery.     RESUME TO WORK/DRIVING/AIR TRAVEL:  You must have clearance from your doctor before returning to work, driving a car, or flying. This will be discussed at your postoperative visit. Do not drive while you are on prescription pain medications.      WOUND/SUTURE CARE:  Keep incision clean and dry at all times.     Your incision will have either staples or sutures.     BATHING/INCISIONAL CARE: You may shower or bathe within 24 hours after surgery, however do not get your incision(s) wet until 4 days after surgery. We recommend that you shower with someone in the bathroom to assist you. Wash, do not scrub your incision. Pat dry the incisions after showering. Do not immerse your incisions in standing bodies of water (bathtub, pool, ocean) for 4 weeks or until cleared by your surgeon at your follow up appointment. Your incision may be open to air; however you may cover the incision with a clean cap, scarf or hat.     SURGICAL DRESSINGS AND SITE CARE: You may remove your head wrap and/or dressings unless already removed or otherwise instructed 48 hours after surgery. There will likely be dried blood on the wrap and/or dressing. Your incision will have either staples or sutures. Keep the surgical incision clean and dry. You do not need to cover up the dressing at this point. If you have an abdominal or thigh incision, keep area clean and open to air. Cover with plastic wrap before showering. The ?Steri-strips? on the incision will come off on their own. You may gently wash your abdominal or thigh incision with soap and water 4 days after your surgery.     SIGNS TO WATCH FOR AT HOME:  Always try to call your doctor first if you are experiencing any of the symptoms below:  - Onset of severe, persistent headache not relieved by medication and rest.  - Onset of increased drowsiness, confusion, stiff neck, or nausea and vomiting.  - Any new onset or worsening of visual problems.  -  Any new onset or worsening of speech problems.  - Any new onset or worsening of swallowing problems.  - Any new onset of or increased weakness, numbness or tingling.  - Any new onset of or increased seizures.  - New weakness or sensory changes  - Fever>101o F  - Shortness of breath  - Chest pain  - Calf/leg pain  - Redness, warmth, swelling or unusual drainage from wound(s)  - Bright red or excessive drainage from the incision(s)  - Drainage that is foul-smelling  - Stitches become loose     For life-threatening emergencies that cannot wait, please go to the nearest Emergency Room for immediate evaluation or dial 911.     HOW TO REACH [place]:  We are available to you at all times.      During business hours, please call [facility]: [phone].      After 5:00 PM, please call the [facility] page operator: [phone]. Ask to have the neurosurgical resident on call contacted for urgent questions.     FOLLOW UP:  You should be seen in our post-operative clinic approximately 2 weeks after your surgery. The physician who discharges you from the hospital will make sure you have a follow up appointment scheduled with your surgeon.     REHABILITATION NEEDS:  If indicated, our rehabilitation professional will assess you prior to your discharge. We will order any rehabilitation needs and equipment prior to your discharge.     FOLLOW-UP APPOINTMENTS:     Follow up with your primary care physician in 1 week after discharge from acute rehab for any health maintenance needs.  Your sutures are absorbable and do not require removal.    Follow up with your neurosurgeon Dr. Emeline Darling as scheduled below. Office: [phone]  Donzetta Kohut, your patient navigator, will contact you after you discharge to arrange oncological follow up. She can be reached at [phone] with any questions or concerns.  You currently have the following appointments already scheduled:             Future Appointments   Date Time Provider Department Center   11/25/2022  8:00 AM Ellyn Hack., MD [facility] [place]

## 2022-11-05 DIAGNOSIS — R001 Bradycardia, unspecified: Secondary | ICD-10-CM

## 2022-11-05 DIAGNOSIS — E042 Nontoxic multinodular goiter: Secondary | ICD-10-CM

## 2022-11-05 DIAGNOSIS — E871 Hypo-osmolality and hyponatremia: Secondary | ICD-10-CM

## 2022-11-05 DIAGNOSIS — Z79890 Hormone replacement therapy (postmenopausal): Secondary | ICD-10-CM

## 2022-11-05 DIAGNOSIS — R4701 Aphasia: Secondary | ICD-10-CM

## 2022-11-05 DIAGNOSIS — I10 Essential (primary) hypertension: Secondary | ICD-10-CM

## 2022-11-05 DIAGNOSIS — Z01818 Encounter for other preprocedural examination: Secondary | ICD-10-CM

## 2022-11-05 DIAGNOSIS — G936 Cerebral edema: Secondary | ICD-10-CM

## 2022-11-05 LAB — Magnesium: MAGNESIUM: 1.9 meq/L (ref 1.4–1.9)

## 2022-11-05 LAB — Prothrombin Time Panel: INR: 1.1 s (ref 11.5–14.4)

## 2022-11-05 LAB — Glucose,POC
GLUCOSE,POC: 168 mg/dL — ABNORMAL HIGH (ref 65–99)
GLUCOSE,POC: 122 mg/dL — ABNORMAL HIGH (ref 65–99)
GLUCOSE,POC: 95 mg/dL (ref 65–99)

## 2022-11-05 LAB — Basic Metabolic Panel
CALCIUM: 9 mg/dL (ref 8.6–10.4)
SODIUM: 132 mmol/L — ABNORMAL LOW (ref 135–146)

## 2022-11-05 LAB — Sodium: SODIUM: 134 mmol/L — ABNORMAL LOW (ref 135–146)

## 2022-11-05 LAB — CBC: ABSOLUTE NUCLEATED RBC COUNT: 0 10*3/uL (ref 0.00–0.00)

## 2022-11-05 LAB — Phosphorus: PHOSPHORUS: 2.6 mg/dL (ref 2.3–4.4)

## 2022-11-05 LAB — APTT: APTT: <24 s — ABNORMAL LOW (ref 24.4–36.2)

## 2022-11-05 LAB — Calcium,Ionized: IONIZED CA++,UNCORRECTED: 1.22 mmol/L (ref 1.09–1.29)

## 2022-11-05 MED ADMIN — DEXAMETHASONE 4 MG PO TABS: 4 mg | ORAL | Stop: 2022-11-11 | NDC 66993073051

## 2022-11-05 MED ADMIN — DEXAMETHASONE 4 MG PO TABS: 4 mg | ORAL | @ 07:00:00 | Stop: 2022-11-11 | NDC 60687071811

## 2022-11-05 MED ADMIN — PROPRANOLOL HCL 10 MG PO TABS: 10 mg | ORAL | @ 12:00:00 | Stop: 2022-11-05

## 2022-11-05 MED ADMIN — HEPARIN SODIUM (PORCINE) 5000 UNIT/ML IJ SOLN: 5000 [IU] | SUBCUTANEOUS | @ 23:00:00 | Stop: 2022-12-05 | NDC 00409272330

## 2022-11-05 MED ADMIN — FLUDROCORTISONE ACETATE 0.1 MG PO TABS: .1 mg | ORAL | @ 13:00:00 | Stop: 2022-12-05 | NDC 50268033011

## 2022-11-05 MED ADMIN — PROPRANOLOL HCL 10 MG PO TABS: 10 mg | ORAL | @ 04:00:00 | Stop: 2022-11-05 | NDC 60687058711

## 2022-11-05 MED ADMIN — DEXAMETHASONE 4 MG PO TABS: 4 mg | ORAL | @ 16:00:00 | Stop: 2022-11-10 | NDC 60687071811

## 2022-11-05 MED ADMIN — DOCUSATE SODIUM 100 MG PO CAPS: 100 mg | ORAL | @ 04:00:00 | Stop: 2022-11-09 | NDC 00904718361

## 2022-11-05 MED ADMIN — INSULIN REGULAR HUMAN 100 UNIT/ML FOR CORRECTIVE HIGH DOSE: 3 mL | SUBCUTANEOUS | @ 05:00:00 | Stop: 2022-12-04 | NDC 00002021301

## 2022-11-05 MED ADMIN — LEVETIRACETAM 1000 MG/100ML RTU: 1000 mg | INTRAVENOUS | @ 16:00:00 | Stop: 2022-11-09 | NDC 44567050201

## 2022-11-05 MED ADMIN — PROPRANOLOL HCL 10 MG PO TABS: 10 mg | ORAL | @ 21:00:00 | Stop: 2022-11-05

## 2022-11-05 MED ADMIN — LEVETIRACETAM 1000 MG/100ML RTU: 1000 mg | INTRAVENOUS | @ 04:00:00 | Stop: 2022-11-09 | NDC 44567050201

## 2022-11-05 MED ADMIN — DOCUSATE SODIUM 100 MG PO CAPS: 100 mg | ORAL | @ 16:00:00 | Stop: 2022-11-09 | NDC 00904718361

## 2022-11-05 NOTE — Consults
INTERNAL MEDICINE CONSULTATION SERVICE     Patient Name: [name]   Patient MRN: [id number]   Date of Birth: 1946-04-12   Date of Service: 11/05/2022     Primary Care Physician: Aviva Kluver, MD   Referring Attending: Ellyn Hack., MD      REASON FOR CONSULT:     Pre-operative risk stratification and optimization      SOURCE OF INFORMATION:     The following history was generated by an interview with the patient and daughter [name] (by phone at bedside), discussion with primary team, and review of medical records available in [facility].      HISTORY OF PRESENT ILLNESS:     Patient ID:    [name] is a 77 y.o. 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 Admitted to neurosurgery on 03/29. IMCS consulted for pre-operative evaluation.    The patient has a medical history that includes (check all that apply; provide details as needed):   []   Coronary artery disease.   []   Cardiac arrhythmia.   []   Valvular heart disease.   []   Cerebrovascular disease (history of CVA, TIA).   []   Heart failure.   []   Diabetes mellitus.   [x]   Thyroid disease: prior partial thyroidectomy, multiple thyroid nodules s/p FNA 09/2022  []   Renal disease.   []   Liver disease.   []   Rheumatologic disease.   []   Obstructive lung disease (COPD, asthma, etc.).   []   Obstructive sleep apnea.   []   Pulmonary hypertension.   []   Current inhaled tobacco smoker.   []   Venous thromboembolic disease (PE, DVT).   []   Hematological and/or oncological disease.     []   None of the above.      Preoperative functional status (check one):   [x]   Totally independent.   []   Partially dependent.   []   Totally dependent.      Exercise/activity tolerance:  The patient is able to perform ADLs, walk around indoors, walk 1--2 block outdoors, climb stairs with a cane, wash dishes/dust, and mop/vacuum without chest pain, shortness of breath, presyncope, syncope, or other cardiopulmonary symptoms. [facility] DASI estimated METS of ~5.0.    PAST MEDICAL/SURGICAL HISTORY:   -- Thyroidectomy 05/2013, partial  -- R total knee replacement 09/2018  -- L shoulder arthroscopy 05/2014  -- L shoulder ORIF 05/2014    * Complications with any surgical procedure listed above: No.   * Personal history of adverse reaction to anesthesia: No.       ABBREVIATED HOSPITAL COURSE:     -- 03/30: IMCS consult for pre-operative evaluation      MEDICATIONS:     Home Medications:  Prior to Admission medications    Medication Sig Start Date End Date Taking? Authorizing Provider   amLODIPine 10 mg tablet Take 1 tablet (10 mg total) by mouth daily. 03/14/22  Yes PROVIDER, HISTORICAL   Ascorbic Acid (VITAMIN C) 1000 MG tablet Take 1 tablet (1,000 mg total) by mouth daily.   Yes PROVIDER, HISTORICAL   Cholecalciferol (VITAMIN D-3) 125 mcg (5000 units) TABS Take 1 tablet (125 mcg total) by mouth daily.   Yes PROVIDER, HISTORICAL   estradiol (ESTRACE) 0.5 mg tablet Take 1 tablet (0.5  mg total) by mouth daily. 03/14/22 03/14/23 Yes PROVIDER, HISTORICAL   Glucosamine HCl (GLUCOSAMINE PO) Take 1 capsule by mouth daily.   Yes PROVIDER, HISTORICAL   medroxyPROGESTERone 5 mg tablet Take 1 tablet (5 mg total) by mouth daily. 03/14/22 03/14/23 Yes PROVIDER, HISTORICAL   METAMUCIL FIBER PO Mix 1 tablespoon in 8 oz of liquid, and take by mouth daily.   Yes PROVIDER, HISTORICAL   naproxen 250 mg tablet Take 1 tablet (250 mg total) by mouth daily as needed (pain).   Yes PROVIDER, HISTORICAL   omeprazole 20 mg DR capsule Take 1 capsule (20 mg total) by mouth daily. 06/23/22 06/18/23 Yes PROVIDER, HISTORICAL   vitamin E 180 mg (400 units) capsule Take 1 capsule (180 mg total) by mouth daily.   Yes PROVIDER, HISTORICAL   dexAMETHasone 4 mg tablet Take 1 tablet (4 mg total) by mouth three (3) times daily. 11/04/22 12/04/22  Rhea Belton, MD   levETIRAcetam 1000 mg tablet Take 1 tablet (1,000 mg total) by mouth two (2) times daily. 11/04/22 12/04/22  Rhea Belton, MD   pantoprazole 40 mg DR tablet Take 1 tablet (40 mg total) by mouth daily. 11/04/22 12/04/22  Rhea Belton, MD       Inpatient Medications:   Scheduled:  dexAMETHasone, 4 mg, Oral, Q8H  docusate, 100 mg, Oral, BID  fludrocortisone, 0.1 mg, Oral, BID  levETIRAcetam, 1,000 mg, Intravenous, Q12H  propranolol, 10 mg, Oral, TID   Continuous:   niCARdipine 50 mg in sodium chloride 0.9% 250 mL drip (PERIPHERAL)      sodium chloride 75 mL/hr (11/04/22 0331)      PRN:  acetaminophen **OR** acetaminophen **OR** acetaminophen suppository, bisacodyl, calcium gluconate IVPB, insulin regular **AND** dextrose, labetalol, magnesium hydroxide, magnesium sulfate IV **OR** magnesium sulfate IV, ondansetron injection/IVPB, ondansetron, potassium chloride IV (peripheral) **OR** potassium chloride IV (peripheral) **OR** potassium chloride IV (peripheral) **OR** potassium chloride IV (peripheral), potassium chloride **OR** potassium chloride **OR** potassium chloride **OR** potassium chloride, potassium chloride **OR** potassium chloride **OR** potassium chloride **OR** potassium chloride, potassium phosphate IV (peripheral) **OR** potassium phosphate IV (peripheral), sodium phosphate       ALLERGIES:     No Known Allergies       PHYSICAL EXAM:     Vitals signs for last 24 hours  Vital Signs:  Temp:  [36.3 ?C (97.3 ?F)-37 ?C (98.6 ?F)] 36.4 ?C (97.5 ?F)  Heart Rate:  [43-73] 53  Resp:  [14-25] 16  BP: (113-147)/(49-85) 115/49  NBP Mean:  [67-101] 67  SpO2:  [96 %-100 %] 96 % I/Os:  I/O last 2 completed shifts:  In: 1165 [P.O.:360; I.V.:565; Other:40; IV Piggyback:200]  Out: 1460 [Urine:1460]     Gen: awake and alert sitting in bed in no acute distress  HEENT: atraumatic, normocephalic, EOM grossly intact, PERRL  CV: normal rate, regular rhythm, normal S1 and S2, no rubs/gallops/murmurs  Lung: no increased work of breathing, CTAB  Abd: no masses, ND, +bowel sounds, no TTP, no rebound/guarding  Ext: 2+ distal radial pulses bilaterally, no LE edema  Neuro: CN III--XII grossly intact, moving all extremities spontaneously  Psych: Calm, appropriate mood/affect, not internally preoccupied       LABORATORY DATA:     CBC  Recent Labs     11/04/22  2145 11/04/22  0339   WBC 30.91* 32.12*   HGB 13.6 14.1   HCT 39.7 41.9   PLT 387 392       BMP  Recent Labs  11/04/22  2145 11/04/22  0339   NA 132* 134*   K 4.5 4.5   CL 101 100   CO2 19* 23   ANIONGAP 12 11   BUN 30* 32*   CREAT 0.62 0.61   CALCIUM 9.0 9.8   GLUCOSE 147* 126*   MG 1.9 1.9   PHOS 2.6 2.6       LFTs  Recent Labs     11/04/22  0339   ALBUMIN 3.5*   TOTPRO 5.8*   BILITOT 0.4   ALKPHOS 67   ALT 24   AST 10*       Coags  Recent Labs     11/04/22  2145 11/04/22  0339   APTT <24.0* <24.0*   PT 14.4 14.4   INR 1.1 1.1       Blood gases:  Recent Labs     11/04/22  0552   LACTATE 25*        Lab Results   Component Value Date    TSH 0.17 (L) 11/04/2022    T4AUTO 1.50 11/04/2022     Lab Results   Component Value Date    HGBA1C 5.7 (H) 11/04/2022     No results found for: ''CHOL'', ''CHOLHDL'', ''CHOLDLCAL'', ''CHOLDLQ'', ''TRIGLY''  No results found for: ''CKTOT'', ''CKMB'', ''TROPONIN'', ''BNP''      DIAGNOSTIC IMAGING/STUDIES:     Radiology    Echo adult transthoracic complete   Final Result by Interface, Rad Results In (03/29 1027)   :       1. Normal LV systolic function. Normal left ventricular regional wall motion.   Left ventricular ejection fraction is approximately 65-70%.   2. Normal LV size and normal wall thickness.   3. Normal LV diastolic function.   4. Normal right ventricular size and normal systolic function. TAPSE 2.4 cm.   DTI 17.0 cm/s.   5. Normal atrial sizes.   6. The aortic valve area is 2.85 cm (Indexed AVA is 1.70 cm/m).       7. Mild tricuspid valve regurgitation.   8. Tricuspid regurgitant jet inadequate to assess pulmonary artery systolic   pressure.   9. There are no prior echocardiograms available for comparison.       --------------------------------------------------------------------------   FINDINGS:       LEFT VENTRICLE: Normal left ventricular size. Normal wall thickness. Normal systolic function. Visually estimated left ventricular ejection fraction 65-70%. Patient demonstrated sinus bradycardia during echocardiogram. Normal LV diastolic function.   LV Wall Motion:   All segments are normal.   RIGHT VENTRICLE: Normal right ventricular size and normal systolic function. TAPSE 2.4 cm. DTI 17.0 cm/s.   LEFT ATRIUM: Normal left atrial size.   RIGHT ATRIUM: Normal right atrial size. Right atrial pressure is estimated at 3 mmHg.   MITRAL VALVE: Structurally normal mitral valve, with normal leaflet excursion. Normal mitral valve thickness. No mitral annular calcification. No evidence of mitral valve stenosis. Trace mitral valve regurgitation.   TRICUSPID VALVE: The tricuspid valve is normal in structure. Mild tricuspid valve regurgitation. Tricuspid regurgitation velocity is not well seen. No significant tricuspid valve stenosis is noted.   AORTIC VALVE: The aortic valve is trileaflet and structurally normal, with normal leaflet excursion. No significant aortic stenosis The aortic valve area is 2.85 cm (Indexed AVA is 1.70 cm/m). No significant aortic stenosis by planimetry (2.24 cm). No    evidence of aortic valve regurgitation.   PULMONIC VALVE: Mild pulmonic valve regurgitation. There is no significant pulmonic valve stenosis.  SYSTEMIC VEINS: The inferior vena cava is normal in size and exhibits greater than 50% respiratory change.   PERICARDIUM: There is no evidence of pericardial effusion.       2D AND M-MODE MEASUREMENTS:   Left Ventricle:   IVSd (2D):      1.0 cm   LVPWd (2D):     0.9 cm   LVIDd (2D):     3.9 cm   LV mass         110   LV SYSTOLIC FUNCTION BY 2D PLANIMETRY (MOD):   WMSI 1.0       LV DIASTOLIC FUNCTION:   MV Peak E: 77 cm/s e' medial   6.6   MV Peak A: 91 cm/s e' lateral  10.6   E/A Ratio: 0.8     e' average  8.6                      Decel Time: 327 msec       Right Ventricle:   TAPSE:           2.4 cm   Aortic Valve: AoV Max Vel: 0.9 m/s AoV Peak PG: 3 mmHg AoV Mean PG: 2 mmHg   LVOT Vmax: 0.90 m/s LVOT VTI: 22 cm AoV VTI:       23 cm                                       LVOT Diameter: 1.9 cm   AoV Area, Vmax: 3.02 cm AoV Area, VTI: 2.85 cm                            AoV area index 1.70 cm/m   Dimensionless index 0.96   AoV Area, planim: 2.24 cm   Tricuspid Valve and PA/RV Systolic Pressure:   RA Pressure: 3 mmHg   Pulmonic Valve:   PV AT: 123 msec       [id number] Lennox Solders MD   Electronically signed by [id number] Lennox Solders MD on 11/04/2022 at 10:27:10 AM       cc: [id number] SEAN KALOOSTIAN        Final       CT brain image import interpretation; Date of Exam: 10/26/2022   Final Result by Stephania Fragmin., MD (03/29 1429)      CT chest image import comparison; Date of Exam: 10/26/2022   Final Result by Radiant, Front Desk (03/29 0529)      XR chest image import comparison; Date of Exam: 10/26/2022   Final Result by Radiant, Front Desk (03/29 0529)      Echocardiogram image import comparison; Date of Exam: 10/27/2022   Final Result by Radiant, Front Desk (03/29 0529)      MR brain image import interpretation; Date of Exam: 10/30/2022   Final Result by Stephania Fragmin., MD (03/29 1449)   IMPRESSION:   Heterogeneously enhancing centrally restricting and necrotic mass located in the left anterior mid temporal lobes with moderate perilesional vasogenic edema, regional mass effect and mild midline shift from left to right at the level of septum    pellucidum. 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       Signed by: Clarene Essex   11/04/2022 2:49 PM      XR chest ap portable (1 view)  Final Result by Cherly Beach. (03/29 1015) IMPRESSION:    1.  Mediastinal soft tissue fullness likely representing enlargement of the thyroid gland. Consider further assessment with thyroid ultrasound or CT, if prior cross-sectional imaging studies are not available for comparison..   2.  Otherwise no acute findings            Signed by: Trilby Drummer   11/04/2022 10:15 AM      MR brain wo+w contrast brain lab protocol    (Results Pending)   MR brain functional wo contrast    (Results Pending)     EKG 11/04/22 reviewed by me shows SR at 47, brady, qtc 406, no concerning ST segment findings    ASSESSMENT:     [name] is a 77 y.o. 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 Admitted to neurosurgery on 03/29. IMCS consulted 3/30 for pre-operative evaluation.    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 Steroids (on for brain lesion with vasogenic edema) can contribute to bradycardia  -- Discontinue propranolol PO and labetalol IV PRN for BP control  -- Avoid further AV nodal blocking agents  -- Maintain continuous cardiac telemetry monitoring    # GERD, POA  -- Continue home omeprazole 20mg  PO daily    # Chronic Low Back Pain, POA  -- Agree with acetaminophen PRN pain control  -- Consider lidocaine patches x2 daily PRN to lower back  -- HOLD home naproxen 250mg  PO BID PRN given plan for neurosurgery   -- Favor discontinuation on discharge given advanced age and risk of PUD/AKI/CKD    # Thyromegaly, POA  # Elevated TSH, POA  # Multiple Thyroid Nodules s/p FNA 09/2022, POA  # Prior Partial Thyroidectomy  POA  Noted on OSH CT chest for malignancy work-up in the setting of brain lesion as above. She is s/p R thryoid nodule removal, but has large L thyroid nodules, TIRADs 3. These were previously evaluated s/p FNA 09/2022 with endocrinology at [facility], negative for malignancy at that time, also sent for sequencing, note with low-medium risk per thygen testing. Follows with endocrinology as outpatient, had to cancel follow up appointment due to current admission. Decreased TSH with normal free T4 during this admission. No clear symptoms.  [ ]  Endocrinology follow-up as outpatient    #Leukocytosis  #CLL  POA  At baseline with WBC in low teens due to CLL. WBC was 14.9 on 3/20 prior to starting steroids at OSH. She is on steroids for brain edema and wbc 30 at admit to [facility]. No infectious signs or  sx  -trend cbc    # Post-Menopausal Vasomotor Symptoms  #on HRT  POA  Long history of taking hormone-replacement therapy. Trialed discontinuation with recurrence of severe vasomotor symptoms/hot flashes and was re-initiated as outpatient in late 07/2022 per patient report. Last dose taken around 03/19 vs 03/20 (prior to admission to [facility]). Favor continuing to hold medication pending upcoming neurosurgical intervention given HRT further increases VTE risk (in general favor holding x 2 weeks prior to surgery if possible).  -- Continue to hold estradiol / medroxyprogesterone until proposed surgery given risk of VTE, last taken around 03/19 vs 03/20 - pt was amenable    #Pre-DM  POA  A1c 5.7 - on steroids at higher risk for hyperglycemia though BS at goal in 100s since admission    RECOMMENDATIONS:     PRE-OPERATIVE IMPRESSION/RISK ASSESSMENT:   The patient is elevated-risk (1.4% by Chales Abrahams) for perioperative major adverse cardiac events (MACE) for the proposed surgery.   Functional capacity (based on history obtained): > or = 4 METS.   ASA physical status classification: III: Severe systemic disease that results in functional limitation..  Risk factors for perioperative pulmonary complications include: age > 50 years*, ASA Classification >/= III*, general anesthesia*, and surgical site (thoracic, abdominal, neurosurgery, head & neck, vascular)*.      RECOMMENDATIONS:  The patient is appropriately risk-stratified for the proposed surgery.  The patient is medically optimized for the proposed surgery.   The patient does not have active medical issues that need to be addressed prior to proceeding with the proposed surgery.   The patient is at mildly elevated risk for MACE for the proposed surgery. However, given ability to achieve ~5.0 METS by [facility] DASI, there is no indication for further risk stratification with stress testing or coronary angiography. Additionally, TTE this admission was reassuring with only mild tricuspid regurgitation and was otherwise within normal limits.  Preoperative laboratory and/or other diagnostic testing: Not indicated.   MRSA decolonization (with mupirocin and/or chlorhexidine): Not Indicated.  Additional recommendations, including perioperative management of chronic medical conditions:  Favor starting pantoprazole 40mg  PO daily for PPX while on corticosteroids  Discontinue propranolol PO and labetalol IV PRN for BP control, would avoid beta blockers for BP control given bradycardia to 40s  Carbohydrate consistent/controlled diet while taking PO, favor discontinuing accuchecks and insulin aspart SS SC given minimal requirements  Continue to hold estradiol / medroxyprogesterone until proposed surgery givencan increase risk of VTE, last taken around 03/19 vs 03/20  Can resume home amlodipine 10mg  PO daily if BP not at goal, please hold on day of surgery if medication is resumed  Favor DVT PPX given elevated VTE risk by Caprini score (7 points, high risk), - defer to neurosurgery primary team given plan for neurosurgical intervention, if no pharm ppx, then consider SCDs  Recommend routine follow-up with primary care physician and/or subspecialty providers following surgery.      Patient seen and discussed with attending physician Dr. Sharol Roussel. Yostin Malacara MD, who is in agreement with the above assessment and plan.    Recommendations were conveyed to the primary team. Thank you for this consult. Please page [phone] with any questions/concerns. We will sign off.    Author:  Alexander Mt, PGY-3  [facility] Internal Medicine  11/05/2022 11:17 AM    Medicine Attending Attestation     I have seen and examined the patient on the date of service. I personally reviewed the interval physician notes, nursing notes, and allied health professional  notes, telemetry data, imaging, labs and microbiologic information over the last 24 hours. I discussed the case with the resident, have reviewed the note above, and agree with the findings as documented which was formulated together with my additions/changes.    Has mildly elevated MACE risk - largely driven by age. By history and DASI has adequate METs - OK to proceed as above without further cardiac eval. Should remain off HRT given elevates VTE risk (last dose seems was ~3/20 prior to admission at OSH). Monitor BS as at risk for hyperglycemia on steroids. Would stop BB as HR into 40s (steroids can also contribute but necessary for edema as above) - monitor off BB.    For consult questions, page [phone].    Alyson Locket, MD  Attending Physician, [facility] Hospitalist Service

## 2022-11-05 NOTE — Progress Notes
NEUROSURGERY PROGRESS NOTE     DATE OF SERVICE:  11/05/2022    PATIENT: [name]        DOB: February 24, 1946    ATTENDING PHYSICIAN:  Ellyn Hack., MD    ADMISSION DATE:  11/04/2022    LENGTH OF STAY:  LOS: 1 day     CHIEF COMPLAINT: brain tumor     ACTIVE MEDICAL PROBLEMS:   Patient Active Problem List   Diagnosis    Brain mass       Present on Admission:   Brain mass      HPI:   [name] is a 77 y.o. year old female not on AC/AP who presents with 2 weeks of progressive speech difficulty that she describes as 'the words coming out wrong' as well as lethargy. Language problems are equally present when speaking in [place] (first language), [place]., and [place]. Denies any memory problems other than what she considers normal aging. Pt reports that she is otherwise in her usual state of health and denies any seizures, confusion, receptive language difficulty, sensory deficits, or gross motor deficits. Otherwise, she is very active and functional at baseline. 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 She has no known hx of systemic canc       INTERVAL EVENTS:   3/30 Consulted IMCS for pre operative clearance .  Pending OR for 11/09/22 with Dr. Allena Katz .       OBJECTIVE:     Vital Signs:   Temp:  [36.3 ?C (97.3 ?F)-37 ?C (98.6 ?F)] 36.4 ?C (97.5 ?F)  Heart Rate:  [43-73] 53  Resp:  [14-25] 16  BP: (113-147)/(49-85) 115/49  NBP Mean:  [67-101] 67  SpO2:  [96 %-100 %] 96 %  On RA    GENERAL: Comfortable, breathing comfortably, resting in bed in no apparent distress    NEURO EXAM:  AAOx3  +comprehension + fluency  PERRL. EOMI. Visual fields intact.  Face symmetric. Hearing intact bilaterally. Tongue midline.  Following commands x4.  Motor: 5/5 throughout.  No pronator drift.  Sensation grossly intact  Incision:     Chest: symmetrical chest rise, CTA in all lobes, NSR, (-)murmur  Vascular: 2+ pulses throughout (-)edema, cyanosis  ABD: soft, nontender, flat, normoactive bowel sounds      LINES AND DRAINS  CENTRAL LINE: None  DRAINS: None  FOLEY: None  SUTURE/STAPLE: 2 weeks post-op    DATA   Pre-operative surgical visit and medical clearance visit documentation obtained and reviewed. Clinical lab tests reviewed daily. All associated imaging for this patient was independently reviewed and interpreted as listed below:    Post-op imaging:   11/04/22 CT brain outside imaging   1. Large mixed density mass centered in the left anterior and mid temporal lobe with moderate perilesional vasogenic edema, regional mass effect and midline shift from left to right at the level of septum pellucidum of 6 mm. 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  2. There is no evidence of subfalcine or uncal herniation.  3. No hydrocephalus.      ASSESSMENT AND PLAN:       NEURO  Brain tumor  Speech difficulties   Hyponatremia   AED: Keppra 1000mg  po  bid  Steroid: Dexamethasone 4mg  Q8 PO  Florinef 0.1mg  po bid x7 days   Suture/staple removal on POD#   Surgery scheduled for 11/09/22   3/30 [facility] consulted for Preop clearance. Appreciate recs    3/31 Repeat NA  in am       PAIN  No active issues  Tylenol and oxycodone PRN    RESPIRATORY  No active issues  Incentive spirometry      CARDIOVASCULAR  Hypertension  Goal SBP 90-160  Keep K >4 and Mg >2  DC propranolol per IMCS    GU / RENAL  No active issues  Continue to monitor UOP    ID  Leukocytosis, afebrile 2/2 dexamethasone  Antibiotics: None   Continue to monitor WBC     HEME / ONC  No active issues  Keep Hgb >7  Continue to monitor H&H    ENDOCRINE  Hyperglycemia  Glucose goal 100-140   Glycemic control with SSI    GI / HEPATIC  No active issues  Diet: Regular   Nausea management: zofran PRN  Bowel regimen  Ulcer prophylaxis    PPx  DVT prophylaxis: subcutaneous  heparin to start?,   SCDs      Disposition  PT/OT/Speech Recs:    The patient was examined and discussed with the resident Neurosurgery team & Attending Ellyn Hack., MD who agrees with assessment and plan listed above.    Author:    Lollie Marrow, NP   12:51 PM 11/05/2022  Department of Neurological Surgery

## 2022-11-05 NOTE — Nursing Note
1930 - Received report from [employer]. Sepsis screen and joint neuro assessment done. Care of patient assumed.    2021 - Patient daughter called spoke with daughter and gave upddates.    2125 - Called and gave report to [place]. Patient escort requested.     2157 - Patient transferred to [facility] without incidcent via bed and monitor. Joint neuro handoff with RN Miran. Care of patient relinquished.

## 2022-11-05 NOTE — Other
Patient's Clinical Goal:   Clinical Goal(s) for the Shift: VSS, safety, comfort  Identify possible barriers to advancing the care plan: None  Stability of the patient:   Progression of Patient's Clinical Goal: NoneReceived pt awake,   A/Ox4, not in any distress. Denies any pain.  Vital signs monitored and recorded. On Accucheck ACHS. On accuchceck ACHS. Family at bedside. Pt. Compliant with all her meds. Needs attended.

## 2022-11-05 NOTE — Other
Patient's Clinical Goal:   Clinical Goal(s) for the Shift: VSS, safety, comfort  Identify possible barriers to advancing the care plan: none  Stability of the patient: Moderately Stable - low risk of patient condition declining or worsening   Progression of Patient's Clinical Goal: Pt admitted for higher level of care with brain mass, Pt AO x 4, non-monitored, RA, voided to restroom, regular diet, denies pain, left arm ecchymosis, neuro check WNL, call light within reach, will continue to monitor.

## 2022-11-06 ENCOUNTER — Ambulatory Visit: Payer: Commercial Managed Care - Pharmacy Benefit Manager

## 2022-11-06 LAB — Sodium: SODIUM: 134 mmol/L — ABNORMAL LOW (ref 135–146)

## 2022-11-06 LAB — Calcium,Ionized: IONIZED CA++,CORRECTED: 1.26 mmol/L (ref 1.09–1.29)

## 2022-11-06 LAB — Phosphorus: PHOSPHORUS: 2.6 mg/dL (ref 2.3–4.4)

## 2022-11-06 LAB — Prothrombin Time Panel: PROTHROMBIN TIME: 14.2 s (ref 11.5–14.4)

## 2022-11-06 LAB — Basic Metabolic Panel
ANION GAP: 13 mmol/L (ref 8–19)
CALCIUM: 9.2 mg/dL (ref 8.6–10.4)

## 2022-11-06 LAB — Glucose,POC
GLUCOSE,POC: 115 mg/dL — ABNORMAL HIGH (ref 65–99)
GLUCOSE,POC: 109 mg/dL — ABNORMAL HIGH (ref 65–99)

## 2022-11-06 LAB — CBC: HEMOGLOBIN: 13.5 g/dL (ref 11.6–15.2)

## 2022-11-06 LAB — APTT: APTT: <24 s — ABNORMAL LOW (ref 24.4–36.2)

## 2022-11-06 LAB — Magnesium: MAGNESIUM: 1.9 meq/L (ref 1.4–1.9)

## 2022-11-06 MED ADMIN — HEPARIN SODIUM (PORCINE) 5000 UNIT/ML IJ SOLN: 5000 [IU] | SUBCUTANEOUS | @ 05:00:00 | Stop: 2022-11-09 | NDC 00409272330

## 2022-11-06 MED ADMIN — DEXAMETHASONE 4 MG PO TABS: 4 mg | ORAL | @ 06:00:00 | Stop: 2022-11-16 | NDC 60687071811

## 2022-11-06 MED ADMIN — GADOBUTROL 1 MMOL/ML IV SOLN: 7.5 mL | INTRAVENOUS | @ 22:00:00 | Stop: 2022-11-06 | NDC 65219028107

## 2022-11-06 MED ADMIN — HEPARIN SODIUM (PORCINE) 5000 UNIT/ML IJ SOLN: 5000 [IU] | SUBCUTANEOUS | @ 17:00:00 | Stop: 2022-11-09 | NDC 00409272330

## 2022-11-06 MED ADMIN — LEVETIRACETAM 1000 MG/100ML RTU: 1000 mg | INTRAVENOUS | @ 17:00:00 | Stop: 2022-11-09 | NDC 44567050201

## 2022-11-06 MED ADMIN — INSULIN REGULAR HUMAN 100 UNIT/ML FOR CORRECTIVE HIGH DOSE: 3 mL | SUBCUTANEOUS | @ 03:00:00 | Stop: 2022-12-04 | NDC 00002021301

## 2022-11-06 MED ADMIN — SODIUM CHLORIDE 1 G PO TABS: 1 g | ORAL | @ 17:00:00 | Stop: 2022-11-10 | NDC 77333084425

## 2022-11-06 MED ADMIN — DEXAMETHASONE 4 MG PO TABS: 4 mg | ORAL | Stop: 2022-11-10 | NDC 60687071811

## 2022-11-06 MED ADMIN — DEXAMETHASONE 4 MG PO TABS: 4 mg | ORAL | @ 18:00:00 | Stop: 2022-11-10 | NDC 60687071811

## 2022-11-06 MED ADMIN — FLUDROCORTISONE ACETATE 0.1 MG PO TABS: .1 mg | ORAL | @ 05:00:00 | Stop: 2022-11-06 | NDC 50268033011

## 2022-11-06 MED ADMIN — FLUDROCORTISONE ACETATE 0.1 MG PO TABS: .2 mg | ORAL | @ 17:00:00 | Stop: 2022-11-10 | NDC 50268033011

## 2022-11-06 MED ADMIN — DOCUSATE SODIUM 100 MG PO CAPS: 100 mg | ORAL | @ 05:00:00 | Stop: 2022-11-09

## 2022-11-06 MED ADMIN — LEVETIRACETAM 1000 MG/100ML RTU: 1000 mg | INTRAVENOUS | @ 05:00:00 | Stop: 2022-11-09 | NDC 44567050201

## 2022-11-06 MED ADMIN — HEPARIN SODIUM (PORCINE) 5000 UNIT/ML IJ SOLN: 5000 [IU] | SUBCUTANEOUS | @ 05:00:00 | Stop: 2022-12-05

## 2022-11-06 MED ADMIN — DOCUSATE SODIUM 100 MG PO CAPS: 100 mg | ORAL | @ 17:00:00 | Stop: 2022-11-09 | NDC 00904718361

## 2022-11-06 NOTE — Other
Patient's Clinical Goal:   Clinical Goal(s) for the Shift: VSS, safety, comfort  Identify possible barriers to advancing the care plan: None.  Stability of the patient: Moderately Unstable - medium risk of patient condition declining or worsening    Progression of Patient's Clinical Goal: Alert and oriented x 4. Received Keppra IV PB and Dexamethasone PO. Ac cu check AC & HS, required SS coverage for HS BG of 115. B MAT 4, voids per BRP. MRI pendinq, will hand off

## 2022-11-06 NOTE — Progress Notes
NEUROSURGERY PROGRESS NOTE     DATE OF SERVICE:  11/06/2022    PATIENT: [name]        DOB: 25-Oct-1945    ATTENDING PHYSICIAN:  Ellyn Hack., MD    ADMISSION DATE:  11/04/2022    LENGTH OF STAY:  LOS: 2 days     CHIEF COMPLAINT: brain tumor     ACTIVE MEDICAL PROBLEMS:   Patient Active Problem List   Diagnosis    Brain mass       Present on Admission:   Brain mass      HPI:   [name] is a 77 y.o. year old female not on AC/AP who presents with 2 weeks of progressive speech difficulty that she describes as 'the words coming out wrong' as well as lethargy. Language problems are equally present when speaking in [place] (first language), [place] and [place]. Denies any memory problems other than what she considers normal aging. Pt reports that she is otherwise in her usual state of health and denies any seizures, confusion, receptive language difficulty, sensory deficits, or gross motor deficits. Otherwise, she is very active and functional at baseline. 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 She has no known hx of systemic canc       INTERVAL EVENTS:   3/30 Consulted IMCS for pre operative clearance. Pending OR for 11/09/22 with Dr. Allena Katz .   3/31: Na 132 (134). Discont 75cc NaCl. 0.2mg  bid Flurinef. 1g salt tab bid today. Neurointact at bedside including language. Sitting bedside, eating, walking. Plan for OR 4/3. Cleared IMCS.    OBJECTIVE:     Vital Signs:   Temp:  [36.4 ?C (97.5 ?F)-36.8 ?C (98.2 ?F)] 36.8 ?C (98.2 ?F)  Heart Rate:  [47-66] 55  Resp:  [14-16] 15  BP: (112-130)/(49-68) 122/60  NBP Mean:  [67-86] 76  SpO2:  [96 %-98 %] 98 %  On RA    GENERAL: Comfortable, breathing comfortably, resting in bed in no apparent distress    NEURO EXAM:  AAOx3  +comprehension + fluency  PERRL. EOMI. Visual fields intact.  Face symmetric. Hearing intact bilaterally. Tongue midline.  Following commands x4.  Motor: 5/5 throughout.  No pronator drift.  Sensation grossly intact  Incision: Chest: symmetrical chest rise, CTA in all lobes, NSR, (-)murmur  Vascular: 2+ pulses throughout (-)edema, cyanosis  ABD: soft, nontender, flat, normoactive bowel sounds      LINES AND DRAINS  CENTRAL LINE: None  DRAINS: None  FOLEY: None  SUTURE/STAPLE: 2 weeks post-op    DATA   Pre-operative surgical visit and medical clearance visit documentation obtained and reviewed. Clinical lab tests reviewed daily. All associated imaging for this patient was independently reviewed and interpreted as listed below:    Post-op imaging:   11/04/22 CT brain outside imaging   1. Large mixed density mass centered in the left anterior and mid temporal lobe with moderate perilesional vasogenic edema, regional mass effect and midline shift from left to right at the level of septum pellucidum of 6 mm. 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  2. There is no evidence of subfalcine or uncal herniation.  3. No hydrocephalus.      ASSESSMENT AND PLAN:       NEURO  Brain tumor  Subacute aphasia (POA )  Hyponatremia   Vasogenic edema (POA)   AED: Keppra 1000mg  po  bid  Steroid: Dexamethasone 4mg  Q8 PO  Florinef 0.2mg  po bid x7  days   Suture/staple removal on POD#   Surgery scheduled for 11/09/22   3/30 [facility] consulted for Preop clearance. Appreciate recs    Cleared for OR per [facility]  3/31 Repeat NA in am   Discont NaCl 75cc/h, cont 0.2 Flurinef bid, salt tabs 1g bid 3/31 am      PAIN  No active issues  Tylenol and oxycodone PRN    RESPIRATORY  No active issues  Incentive spirometry      CARDIOVASCULAR  Hypertension  Bradycardia   Goal SBP 90-160  Keep K >4 and Mg >2  DC propranolol per IMCS    GU / RENAL  No active issues  Continue to monitor UOP    ID  Leukocytosis, afebrile 2/2 dexamethasone  Antibiotics: None   Continue to monitor WBC     HEME / ONC  No active issues  Keep Hgb >7  Continue to monitor H&H    ENDOCRINE  Hyperglycemia  Glucose goal 100-140   Glycemic control with SSI    GI / HEPATIC  No active issues  Diet: Regular   Nausea management: zofran PRN  Bowel regimen  Ulcer prophylaxis    PPx  DVT prophylaxis: SQH started today,   SCDs      Disposition  PT/OT/Speech Recs:    The patient was examined and discussed with the resident Neurosurgery team & Attending Ellyn Hack., MD who agrees with assessment and plan listed above.    Author:    Pryor Ochoa, MD   8:12 AM 11/06/2022  Department of Neurological Surgery

## 2022-11-07 ENCOUNTER — Telehealth: Payer: Commercial Managed Care - Pharmacy Benefit Manager

## 2022-11-07 LAB — Magnesium: MAGNESIUM: 1.9 meq/L (ref 1.4–1.9)

## 2022-11-07 LAB — Glucose,POC
GLUCOSE,POC: 141 mg/dL — ABNORMAL HIGH (ref 65–99)
GLUCOSE,POC: 119 mg/dL — ABNORMAL HIGH (ref 65–99)
GLUCOSE,POC: 92 mg/dL (ref 65–99)
GLUCOSE,POC: 92 mg/dL (ref 65–99)

## 2022-11-07 LAB — Basic Metabolic Panel
GLUCOSE: 152 mg/dL — ABNORMAL HIGH (ref 65–99)
UREA NITROGEN: 31 mg/dL — ABNORMAL HIGH (ref 7–22)

## 2022-11-07 LAB — Phosphorus: PHOSPHORUS: 2.1 mg/dL — ABNORMAL LOW (ref 2.3–4.4)

## 2022-11-07 LAB — CBC: MEAN PLATELET VOLUME: 9.5 fL (ref 9.3–13.0)

## 2022-11-07 LAB — Sodium
SODIUM: 135 mmol/L (ref 135–146)
SODIUM: 136 mmol/L (ref 135–146)
SODIUM: 138 mmol/L (ref 135–146)

## 2022-11-07 LAB — Prothrombin Time Panel: INR: 1.1 s (ref 11.5–14.4)

## 2022-11-07 LAB — APTT: APTT: 24 s — ABNORMAL LOW (ref 24.4–36.2)

## 2022-11-07 LAB — Calcium,Ionized: IONIZED CA++,CORRECTED: 1.21 mmol/L (ref 1.09–1.29)

## 2022-11-07 MED ADMIN — DOCUSATE SODIUM 100 MG PO CAPS: 100 mg | ORAL | @ 04:00:00 | Stop: 2022-12-04 | NDC 00904718361

## 2022-11-07 MED ADMIN — DEXAMETHASONE 4 MG PO TABS: 4 mg | ORAL | @ 15:00:00 | Stop: 2022-11-10 | NDC 60687071811

## 2022-11-07 MED ADMIN — DOCUSATE SODIUM 100 MG PO CAPS: 100 mg | ORAL | @ 17:00:00 | Stop: 2022-11-09

## 2022-11-07 MED ADMIN — LEVETIRACETAM 1000 MG/100ML RTU: 1000 mg | INTRAVENOUS | @ 04:00:00 | Stop: 2022-11-09 | NDC 44567050201

## 2022-11-07 MED ADMIN — INSULIN REGULAR HUMAN 100 UNIT/ML FOR CORRECTIVE HIGH DOSE: 3 mL | SUBCUTANEOUS | @ 07:00:00 | Stop: 2022-12-04 | NDC 00002021301

## 2022-11-07 MED ADMIN — HEPARIN SODIUM (PORCINE) 5000 UNIT/ML IJ SOLN: 5000 [IU] | SUBCUTANEOUS | @ 04:00:00 | Stop: 2022-11-09 | NDC 00409272330

## 2022-11-07 MED ADMIN — DEXAMETHASONE 4 MG PO TABS: 4 mg | ORAL | @ 23:00:00 | Stop: 2022-11-11 | NDC 60687071811

## 2022-11-07 MED ADMIN — FLUDROCORTISONE ACETATE 0.1 MG PO TABS: .2 mg | ORAL | @ 04:00:00 | Stop: 2022-12-05 | NDC 50268033011

## 2022-11-07 MED ADMIN — HEPARIN SODIUM (PORCINE) 5000 UNIT/ML IJ SOLN: 5000 [IU] | SUBCUTANEOUS | @ 15:00:00 | Stop: 2022-11-09 | NDC 00409272330

## 2022-11-07 MED ADMIN — DEXAMETHASONE 4 MG PO TABS: 4 mg | ORAL | @ 07:00:00 | Stop: 2022-11-10 | NDC 60687071811

## 2022-11-07 MED ADMIN — LEVETIRACETAM 1000 MG/100ML RTU: 1000 mg | INTRAVENOUS | @ 15:00:00 | Stop: 2022-11-09 | NDC 44567050201

## 2022-11-07 MED ADMIN — FLUDROCORTISONE ACETATE 0.1 MG PO TABS: .2 mg | ORAL | @ 15:00:00 | Stop: 2022-12-05 | NDC 50268033011

## 2022-11-07 MED ADMIN — DEXAMETHASONE 4 MG PO TABS: 4 mg | ORAL | @ 01:00:00 | Stop: 2022-11-10 | NDC 60687071811

## 2022-11-07 MED ADMIN — INSULIN REGULAR HUMAN 100 UNIT/ML FOR CORRECTIVE HIGH DOSE: 3 mL | SUBCUTANEOUS | @ 01:00:00 | Stop: 2022-12-04 | NDC 00002021301

## 2022-11-07 MED ADMIN — SODIUM CHLORIDE 1 G PO TABS: 1 g | ORAL | @ 15:00:00 | Stop: 2022-12-06 | NDC 77333084425

## 2022-11-07 MED ADMIN — SODIUM CHLORIDE 1 G PO TABS: 1 g | ORAL | @ 04:00:00 | Stop: 2022-11-10 | NDC 77333084425

## 2022-11-07 NOTE — Telephone Encounter
Call Back Request      Reason for call back: Pt's daughter Shirlee Limerick is calling on behalf of the pt in regards to pt's upcoming surgery. Shirlee Limerick is requesting to speak to Jared/Vanessa in regards to finding out what's available for family members to stay. Shirlee Limerick would like to know if the hospital make any accomodation or has spaces for them to stay overnight, or should they make some other arrangements. Please advise ASAP, thank you.    CBN: [phone]    Any Symptoms:  []  Yes  [x]  No      If yes, what symptoms are you experiencing:    Duration of symptoms (how long):    Have you taken medication for symptoms (OTC or Rx):      If call was taken outside of clinic hours:    [] Patient or caller has been notified that this message was sent outside of normal clinic hours.     [] Patient or caller has been warm transferred to the physician's answering service. If applicable, patient or caller informed to please call us back if symptoms progress.  Patient or caller has been notified of the turnaround time of 1-2 business day(s).

## 2022-11-07 NOTE — Other
Patient's Clinical Goal:   Clinical Goal(s) for the Shift: VSS, safety, comfort  Identify possible barriers to advancing the care plan:   Stability of the patient: Moderately Stable - low risk of patient condition declining or worsening   Progression of Patient's Clinical Goal:   VSS, no acute distress noted. Pending funtional MRI tomorrow 11/08/22 between 0600-0700 per MRI tech. Pending OR 11/09/22 per latest MD note. SBP & NA within goal. All needs addressed/met, call light within reach.

## 2022-11-07 NOTE — Other
Patient's Clinical Goal:   Clinical Goal(s) for the Shift: VSS, safety, comfort  Identify possible barriers to advancing the care plan: N/A  Stability of the patient: Moderately Stable - low risk of patient condition declining or worsening   Progression of Patient's Clinical Goal: Aox4, pleasant, BMAT 4, cane at bedside though ambulating independently, new IV placed today, MRI completed, no complaints pain, VSS. No events, safety measures in place.

## 2022-11-07 NOTE — Telephone Encounter
Spoke with patient's daughter and gave details of [place]. Advised that case is still awaiting placement and we will let daughter know when it is placed.    All questions answered.

## 2022-11-08 ENCOUNTER — Ambulatory Visit: Payer: Commercial Managed Care - Pharmacy Benefit Manager

## 2022-11-08 DIAGNOSIS — T380X5S Adverse effect of glucocorticoids and synthetic analogues, sequela: Secondary | ICD-10-CM

## 2022-11-08 DIAGNOSIS — D72829 Elevated white blood cell count, unspecified: Secondary | ICD-10-CM

## 2022-11-08 DIAGNOSIS — H547 Unspecified visual loss: Secondary | ICD-10-CM

## 2022-11-08 LAB — Sodium
SODIUM: 135 mmol/L (ref 135–146)
SODIUM: 138 mmol/L (ref 135–146)
SODIUM: 139 mmol/L (ref 135–146)

## 2022-11-08 LAB — Glucose,POC
GLUCOSE,POC: 92 mg/dL (ref 65–99)
GLUCOSE,POC: 157 mg/dL — ABNORMAL HIGH (ref 65–99)
GLUCOSE,POC: 102 mg/dL — ABNORMAL HIGH (ref 65–99)
GLUCOSE,POC: 96 mg/dL (ref 65–99)

## 2022-11-08 LAB — Basic Metabolic Panel
CREATININE: 0.7 mg/dL (ref 0.60–1.30)
ESTIMATED GFR 2021 CKD-EPI: 88 mL/min/{1.73_m2} (ref 8–19)

## 2022-11-08 LAB — Phosphorus: PHOSPHORUS: 1.6 mg/dL — ABNORMAL LOW (ref 2.3–4.4)

## 2022-11-08 LAB — Magnesium: MAGNESIUM: 1.7 meq/L (ref 1.4–1.9)

## 2022-11-08 LAB — APTT: APTT: <24 s — ABNORMAL LOW (ref 24.4–36.2)

## 2022-11-08 LAB — Calcium,Ionized: IONIZED CA++,UNCORRECTED: 1.21 mmol/L (ref 1.09–1.29)

## 2022-11-08 LAB — CBC: HEMATOCRIT: 37.1 (ref 34.9–45.2)

## 2022-11-08 LAB — Prothrombin Time Panel: INR: 1.1 s (ref 11.5–14.4)

## 2022-11-08 MED ADMIN — FLUDROCORTISONE ACETATE 0.1 MG PO TABS: .2 mg | ORAL | @ 04:00:00 | Stop: 2022-11-10 | NDC 50268033011

## 2022-11-08 MED ADMIN — CALCIUM CARBONATE ANTACID 500 (200 CA) MG PO CHEW: 1000 mg | ORAL | @ 06:00:00 | Stop: 2022-12-08 | NDC 68084098833

## 2022-11-08 MED ADMIN — PANTOPRAZOLE SODIUM 40 MG PO TBEC: 40 mg | ORAL | @ 16:00:00 | Stop: 2022-12-08 | NDC 50268063911

## 2022-11-08 MED ADMIN — DEXAMETHASONE 4 MG PO TABS: 4 mg | ORAL | @ 16:00:00 | Stop: 2022-11-10 | NDC 60687071811

## 2022-11-08 MED ADMIN — HEPARIN SODIUM (PORCINE) 5000 UNIT/ML IJ SOLN: 5000 [IU] | SUBCUTANEOUS | @ 04:00:00 | Stop: 2022-11-09 | NDC 00409272330

## 2022-11-08 MED ADMIN — DEXAMETHASONE 4 MG PO TABS: 4 mg | ORAL | @ 06:00:00 | Stop: 2022-11-10 | NDC 60687071811

## 2022-11-08 MED ADMIN — SODIUM CHLORIDE 1 G PO TABS: 1 g | ORAL | @ 04:00:00 | Stop: 2022-11-10 | NDC 77333084425

## 2022-11-08 MED ADMIN — DOCUSATE SODIUM 100 MG PO CAPS: 100 mg | ORAL | @ 16:00:00 | Stop: 2022-11-09

## 2022-11-08 MED ADMIN — SODIUM CHLORIDE 1 G PO TABS: 1 g | ORAL | @ 16:00:00 | Stop: 2022-11-10 | NDC 77333084425

## 2022-11-08 MED ADMIN — CALCIUM CARBONATE ANTACID 500 (200 CA) MG PO CHEW: 1000 mg | ORAL | @ 16:00:00 | Stop: 2022-11-19 | NDC 68084098833

## 2022-11-08 MED ADMIN — DOCUSATE SODIUM 100 MG PO CAPS: 100 mg | ORAL | @ 04:00:00 | Stop: 2022-11-09 | NDC 00904718361

## 2022-11-08 MED ADMIN — LEVETIRACETAM 1000 MG/100ML RTU: 1000 mg | INTRAVENOUS | @ 16:00:00 | Stop: 2022-11-09 | NDC 44567050201

## 2022-11-08 MED ADMIN — POTASSIUM CHLORIDE CRYS ER 20 MEQ PO TBCR: 40 meq | ORAL | @ 23:00:00 | Stop: 2022-11-08 | NDC 00245531989

## 2022-11-08 MED ADMIN — LEVETIRACETAM 1000 MG/100ML RTU: 1000 mg | INTRAVENOUS | @ 04:00:00 | Stop: 2022-11-09 | NDC 44567050201

## 2022-11-08 MED ADMIN — FLUDROCORTISONE ACETATE 0.1 MG PO TABS: .2 mg | ORAL | @ 16:00:00 | Stop: 2022-11-10 | NDC 50268033011

## 2022-11-08 MED ADMIN — INSULIN REGULAR HUMAN 100 UNIT/ML FOR CORRECTIVE HIGH DOSE: 3 mL | SUBCUTANEOUS | @ 04:00:00 | Stop: 2022-12-04 | NDC 00002021301

## 2022-11-08 MED ADMIN — INSULIN REGULAR HUMAN 100 UNIT/ML FOR CORRECTIVE HIGH DOSE: 3 mL | SUBCUTANEOUS | @ 16:00:00 | Stop: 2022-12-04 | NDC 00002021301

## 2022-11-08 MED ADMIN — HEPARIN SODIUM (PORCINE) 5000 UNIT/ML IJ SOLN: 5000 [IU] | SUBCUTANEOUS | @ 16:00:00 | Stop: 2022-11-09 | NDC 00409272330

## 2022-11-08 NOTE — Progress Notes
NEUROSURGERY PROGRESS NOTE     DATE OF SERVICE:  11/08/2022    PATIENT: [name]        DOB: 1946/07/20    ATTENDING PHYSICIAN:  Ellyn Hack., MD    ADMISSION DATE:  11/04/2022    LENGTH OF STAY:  LOS: 4 days     CHIEF COMPLAINT: brain tumor     ACTIVE MEDICAL PROBLEMS:   Patient Active Problem List   Diagnosis    Brain mass       Present on Admission:   Brain mass      HPI:   [name] is a 77 y.o. year old female not on AC/AP who presents with 2 weeks of progressive speech difficulty that she describes as 'the words coming out wrong' as well as lethargy. Language problems are equally present when speaking in [place] (first language), [place] and [place]. Denies any memory problems other than what she considers normal aging. Pt reports that she is otherwise in her usual state of health and denies any seizures, confusion, receptive language difficulty, sensory deficits, or gross motor deficits. Otherwise, she is very active and functional at baseline. 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 She has no known hx of systemic canc       INTERVAL EVENTS:   3/30 Consulted IMCS for pre operative clearance. Pending OR for 11/09/22 with Dr. Allena Katz .   3/31: Na 132 (134). Discont 75cc NaCl. 0.2mg  bid Florinef. 1g salt tab bid today. Neurointact at bedside including language. Sitting bedside, eating, walking. Plan for OR 4/3. Cleared IMCS.  4/1: AFVSS. NAEON. Na 134 (135). Florinef, salt tabs, 3% 12.5cc/h. Dex/Keppra. OR Weds 4/3.  4/2: AFVSS. NAEON. Na 136 (135). Florinef, salt tabs, 3% 12.5cc/h. Dex/Keppra. OR Weds 4/3. Functional MRI scheduled for 6 am.  3%NaCL stopped  NPO P MN/IVF    OBJECTIVE:     Vital Signs:   Temp:  [36.2 ?C (97.2 ?F)-37 ?C (98.6 ?F)] 36.6 ?C (97.9 ?F)  Heart Rate:  [58-70] 70  Resp:  [17-18] 18  BP: (105-149)/(54-74) 145/74  NBP Mean:  [70-94] 94  SpO2:  [94 %-96 %] 96 %  On RA    GENERAL: Comfortable, breathing comfortably, resting in bed in no apparent distress    NEURO EXAM:  AAOx3  +comprehension + fluency  PERRL. EOMI. Visual fields intact.: Impaired vision OU  Face symmetric. Hearing intact bilaterally. Tongue midline.  Following commands x4.  Motor: 5/5 throughout. Bilateral lower 4/5 No pronator drift.  Sensation grossly intact  Incision:     Chest: symmetrical chest rise, CTA in all lobes, NSR, (-)murmur  Vascular: 2+ pulses throughout (-)edema, cyanosis  ABD: soft, nontender, flat, normoactive bowel sounds LBM 4/1  Voiding      LINES AND DRAINS  CENTRAL LINE: None  DRAINS: None  FOLEY: None  SUTURE/STAPLE: 2 weeks post-op    DATA   Pre-operative surgical visit and medical clearance visit documentation obtained and reviewed. Clinical lab tests reviewed daily. All associated imaging for this patient was independently reviewed and interpreted as listed below:    Post-op imaging:   11/04/22 CT brain outside imaging   1. Large mixed density mass centered in the left anterior and mid temporal lobe with moderate perilesional vasogenic edema, regional mass effect and midline shift from left to right at the level of septum pellucidum of 6 mm. 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  2. There is no evidence of  subfalcine or uncal herniation.  3. No hydrocephalus.      ASSESSMENT AND PLAN:   76F admitted to inpatient pending OR, craniotomy for tumor resection.    NEURO  Brain tumor  Subacute aphasia (POA )  Hyponatremia (goal 135-145)  discont NaCl 75cc/h  cont 0.2 Flurinef bid  salt tabs 1g bid 3/31 am  3% NaCl 12.5cc/h 4/1 > stopped   Vasogenic edema (POA)   AED: Keppra 1000mg  po  bid  Steroid: Dexamethasone 4mg  Q8 PO  Florinef 0.2mg  po bid x7 days   Suture/staple removal on POD#   Surgery scheduled for 11/09/22   3/30 [facility] consulted for Preop clearance. Appreciate recs    Cleared for OR per IMCS    PAIN  No active issues  Tylenol and oxycodone PRN    RESPIRATORY  No active issues  Incentive spirometry    CARDIOVASCULAR  Hypertension  Bradycardia   Goal SBP 90-160  Keep K >4 and Mg >2  DC propranolol per IMCS    GU / RENAL  No active issues  Continue to monitor UOP    ID  Leukocytosis, afebrile 2/2 dexamethasone  Antibiotics: None   Continue to monitor WBC     HEME / ONC  No active issues  Keep Hgb >7  Continue to monitor H&H    ENDOCRINE  Hyperglycemia  Glucose goal 100-140   Glycemic control with SSI    GI / HEPATIC  No active issues  Diet: Regular > NPO P MN with IVF 50cc/hr   Nausea management: zofran PRN  Bowel regimen  Ulcer prophylaxis    PPx  DVT prophylaxis: SQH started today,   SCDs    Disposition  PT/OT/Speech Recs: pending postop eval    The patient was examined and discussed with the resident Neurosurgery team & Attending Ellyn Hack., MD who agrees with assessment and plan listed above.    Author:    Pryor Ochoa, MD   5:16 AM 11/08/2022  Department of Neurological Surgery

## 2022-11-08 NOTE — Other
Patient's Clinical Goal:   Clinical Goal(s) for the Shift: VSS, safety, comfort  Identify possible barriers to advancing the care plan: None.  Stability of the patient: Moderately Unstable - medium risk of patient condition declining or worsening    Progression of Patient's Clinical Goal: Alert and oriented x 4.  Dx of brain mass and hx of chronic back pain (kyphosis), GERD. Ambulates with a cane, gait slow and steady.  MRI brain functional pending 0630-0700 today. Continue on IV PB Keppra and oral Dexamethasone No seizure activity noted. Blood glucose checked Q AC and HS. Na level 136 and within parameter (135-145) Pt is on OR schedule for 11/09/2022 for resection of brain mass. Will continue to monitor.

## 2022-11-08 NOTE — Other
Patient's Clinical Goal:   Clinical Goal(s) for the Shift: VSS, safety, comfort  Identify possible barriers to advancing the care plan: None  Stability of the patient: Moderately Unstable - medium risk of patient condition declining or worsening    Progression of Patient's Clinical Goal: A/O x 4. Ambulates with cane. Voids per BR. MRI of Brain functional due at 0630-0700 today. OR schedule for brain mass resection. Denies pain and declines pain medication.

## 2022-11-08 NOTE — Telephone Encounter
I spoke with patient/family to introduce myself, discuss my role and next steps.  I will contact patient/family post discharge to ensure recommended follow up.

## 2022-11-08 NOTE — Progress Notes
NUTRITION IN-DEPTH SCREEN (Adult)    Admit Date: 11/04/2022     Date of Birth: [birth year] Gender: female MRN: [id number]     Date of Screening 11/08/2022   Subjective: Upon visit, pt sleeping. Per records, pt tolerating meals with excellent PO intake.  76-100%  x 5 meals recorded. No food allergies noted. Continue to follow.   Problems: Principal Problem:    Brain mass (POA: Yes)       No past medical history on file. No past surgical history on file.      Anthropometrics     Height: 160 cm (5' 3'')  Admit Weight: 64.5 kg (142 lb 3.2 oz) (11/04/22 0300) Last 5 recorded weights:      11/04/2022     3:00 AM 11/06/2022     4:31 AM   Weights   Weight 64.5 kg 65.2 kg            IBW: 52.2 kg (115 lb)  % Ideal Body Weight: 125 %  BMI (Calculated): 25.46              Allergies   Patient has no known allergies.     Cultural / Religious / Ethnic Food Preferences   Unable to Assess       Nutrition Prior to Admission   UTA     Nutrition Risk Factors   Moderate Nutrition Risk Factors:  Brain mass  Acuity Level: 2-Moderate risk        Diet Orders     Diets/Supplements/Feeds   Diet    Diet regular     Start Date/Time: 11/04/22 1640      Number of Occurrences: Until Specified        Impression   PO % consumed: 76 to 100% x 5 meals.  Impression: Diet tolerated well with good intake. Pt selecting balanced meals.   No nausea / emesis noted. Overweight (BMI 25-29).     Diet Education   No diet education needs at this time      FDI Target Drugs: No          Nutrition Care Plan   Plan: Continue with diet as ordered. Monitor adequacy of intake.   Monitor tolerance to diet. Trend weights.    Comments: Continue to follow.      Next Follow-up by 11/15/22    Author:  Richarda Overlie, DTR, pager [phone]  11/08/2022 1:48 PM

## 2022-11-08 NOTE — Consults
CASE MANAGER ASSESSMENT      Admit [id number]    Date of Initial CM Assessment: 11/07/2022    Problems: Principal Problem:    Brain mass (POA: Yes)       No past medical history on file. No past surgical history on file.       Primary Care Physician:Pcp, No, MD  Phone:None      NEEDS ASSESSMENT:     Level of Function Prior to Admit: Minimal Assist    Primary Living Situation: Lives w/Capable Spouse    Hours of Assistance/Day: As Needed    Pre-admission Living Situation: Home/Apartment, 1-Story       Primary Support Systems: Spouse/significant other, Children    Contact Name: Gali, Spinney'' Phone Number: [phone]   Does the patient have a Family/Support System member participating in Discharge Planning?: Yes    DPOA?: No       Bathroom on Main Floor: Yes  Stairs in Home: 0     Prior Treatments / Services: DME      DME Owned by Patient: Gilmer Mor, Wheelchair, Environmental consultant, Tour manager (Patient only uses a single point cane)    Who is your PCP?: Dr. Nobie Putnam    Do you have your Primary Care Doctor's office number?: Yes    How often do you visit your doctor?: Annual (As Needed)    Do you need information/education regarding your medical condition?: No       Were you hospitalized in the last 30 days?: No      DISCHARGE ASSESSMENT:     Projected Date of Discharge: 11/08/2022    Anticipated Complex D/C?: No    Projected Discharge to: Other (Comment)    Discharge Address: Home Address: [address]  [place] [address]    Projected Discharge Needs: Other (Comment) (TBD)      Support Identified at Discharge: Spouse, Child  Name of Discharge Support Person: Lecanu-Fayet,Mayetta ''[name] (Daughter) Phone Number: [phone]     Who is available to transport you upon discharge?: Family Transportation         SDOH     Within the past 12 months, has a lack of transportation kept you from medical appointments, meetings, work, or from getting things needed for daily living? : No  How hard is it for you to pay for the very basics like food, housing, medical care, and heating?: Not hard at all  How hard is it for you to pay for prescriptions or medical bills?: Not hard at all  In the past 12 months has the electric, gas, oil, or water company threatened to shut off services in your home?: No  In the last 12 months, was there a time when you were not able to pay the mortgage or rent on time?: No  In the last 12 months, how many places have you lived?: 1  In the last 12 months, was there a time when you did not have a steady place to sleep or slept in a shelter (including now)?: No      11:44 am: CM s/w the patient at bedside, explained role in the patient's care team and verified above information.      Comments: Assigned CM will continue to re-assess safe discharge plan based on primary medical and interdisciplinary team recommendation(s).     Gillian Scarce RN, BSN  Covering Clinical Case Manager   Neurosurgery ~ [facility]  Tel: [phone]   ~   Pager:  [phone]    Department of Care Coordination Main Office:   Phone: [phone]   ~ Fax: [phone]    For Assistance After Hours:   Weekdays Swing Shift : Pager [phone]  ED CM: [phone] ~ Pager [phone]   Weekends: [phone] ~ Pager [phone]

## 2022-11-08 NOTE — Progress Notes
IP CM ACTIVE DISCHARGE PLANNING  Department of Care Coordination      Admit [phone]  Anticipated Date of Discharge: 11/11/2022    Following [id number], Maree Krabbe., MD      Today's short update     Per IDRs, OR for tumor resection on Weds 4/3. Functional MRI scheduled today at 6am    Disposition     Pending plan  Home Address: [place] [address]  Family/Support System in agreement with the current discharge plan: Yes, in agreement and participating        Salli Quarry,  11/08/2022

## 2022-11-09 ENCOUNTER — Ambulatory Visit: Payer: Commercial Managed Care - Pharmacy Benefit Manager

## 2022-11-09 LAB — Ionized Calcium,POC
IONIZED CA,CORR,POC: 1.22 mmol/L (ref 1.09–1.29)
IONIZED CA,CORR,POC: 1.16 mmol/L (ref 1.09–1.29)
IONIZED CA,CORR,POC: 1.09 mmol/L (ref 1.09–1.29)
IONIZED CA,CORR,POC: 1.28 mmol/L (ref 1.09–1.29)
IONIZED CA,UNCORR,POC: 1.15 mmol/L (ref 1.09–1.29)

## 2022-11-09 LAB — Glucose,POC
GLUCOSE,POC: 112 mg/dL — ABNORMAL HIGH (ref 65–99)
GLUCOSE,POC: 120 mg/dL — ABNORMAL HIGH (ref 65–99)
GLUCOSE,POC: 93 mg/dL (ref 65–99)
GLUCOSE,POC: 98 mg/dL (ref 65–99)
GLUCOSE,POC: 110 mg/dL — ABNORMAL HIGH (ref 65–99)
GLUCOSE,POC: 117 mg/dL — ABNORMAL HIGH (ref 65–99)
GLUCOSE,POC: 111 mg/dL — ABNORMAL HIGH (ref 65–99)
GLUCOSE,POC: 118 mg/dL — ABNORMAL HIGH (ref 65–99)

## 2022-11-09 LAB — Sodium,POC
SODIUM,POC: 134 mmol/L — ABNORMAL LOW (ref 135–146)
SODIUM,POC: 137 mmol/L (ref 135–146)
SODIUM,POC: 136 mmol/L (ref 135–146)
SODIUM,POC: 138 mmol/L (ref 135–146)
SODIUM,POC: 140 mmol/L (ref 135–146)

## 2022-11-09 LAB — COOXIMETRY,POC
HEMOGLOBIN,POC: 12.3 g/dL (ref <2.7)
HEMOGLOBIN,POC: 11.2 g/dL — ABNORMAL LOW (ref <2.7)
METHEMOGLOBIN,POC: 1.2 (ref <2.7)
METHEMOGLOBIN,POC: 0.9 (ref <2.7)
OXYHEMOGLOBIN,POC: 97.9 (ref <2.7)

## 2022-11-09 LAB — Basic Metabolic Panel
ANION GAP: 9 mmol/L (ref 8–19)
POTASSIUM: 4.3 mmol/L (ref 3.6–5.3)

## 2022-11-09 LAB — Sodium
SODIUM: 138 mmol/L (ref 135–146)
SODIUM: 139 mmol/L (ref 135–146)

## 2022-11-09 LAB — CO2,POC
CO2,POC: 24 mmol/L (ref 20–30)
CO2,POC: 24 mmol/L (ref 20–30)
CO2,POC: 25 mmol/L (ref 20–30)
CO2,POC: 24 mmol/L (ref 20–30)
CO2,POC: 26 mmol/L (ref 20–30)

## 2022-11-09 LAB — LACTATE, POC
LACTATE, POCT: 9 mg/dL (ref 5–18)
LACTATE, POCT: 7 mg/dL (ref 5–18)
LACTATE, POCT: 9 mg/dL (ref 5–18)
LACTATE, POCT: 9 mg/dL (ref 5–18)

## 2022-11-09 LAB — Potassium,POC
POTASSIUM,POC: 3.2 mmol/L — ABNORMAL LOW (ref 3.6–5.3)
POTASSIUM,POC: 3.2 mmol/L — ABNORMAL LOW (ref 3.6–5.3)
POTASSIUM,POC: 3.3 mmol/L — ABNORMAL LOW (ref 3.6–5.3)
POTASSIUM,POC: 3.2 mmol/L — ABNORMAL LOW (ref 3.6–5.3)

## 2022-11-09 LAB — Magnesium: MAGNESIUM: 1.9 meq/L (ref 1.4–1.9)

## 2022-11-09 LAB — Calcium,Ionized: IONIZED CA++,CORRECTED: 1.27 mmol/L (ref 1.09–1.29)

## 2022-11-09 LAB — Chloride,POC
CHLORIDE,POC: 102 mmol/L (ref 96–106)
CHLORIDE,POC: 103 mmol/L (ref 96–106)
CHLORIDE,POC: 102 mmol/L (ref 96–106)
CHLORIDE,POC: 103 mmol/L (ref 96–106)
CHLORIDE,POC: 108 mmol/L — ABNORMAL HIGH (ref 96–106)

## 2022-11-09 LAB — Blood Gases, arterial,POC
BASE EXCESS, ARTERIAL,POC: 2 mmol/L (ref >=95.0)
PH, ARTERIAL,POC: 7.45 mmol/L — ABNORMAL HIGH (ref 22.0–26.0)
PO2, ARTERIAL,POC: 157 mmHg — ABNORMAL HIGH (ref 98–118)
PO2, ARTERIAL,POC: 194 mmHg — ABNORMAL HIGH (ref 98–118)
PO2, ARTERIAL,POC: 164 mmHg — ABNORMAL HIGH (ref 98–118)

## 2022-11-09 LAB — CBC: WHITE BLOOD CELL COUNT: 25.88 10*3/uL — ABNORMAL HIGH (ref 4.16–9.95)

## 2022-11-09 LAB — Phosphorus: PHOSPHORUS: 2.1 mg/dL — ABNORMAL LOW (ref 2.3–4.4)

## 2022-11-09 LAB — Prothrombin Time Panel: PROTHROMBIN TIME: 13.8 s (ref 11.5–14.4)

## 2022-11-09 MED ADMIN — DEXAMETHASONE 4 MG PO TABS: 4 mg | ORAL | Stop: 2022-11-10 | NDC 60687071811

## 2022-11-09 MED ADMIN — INSULIN REGULAR HUMAN 100 UNIT/ML FOR CORRECTIVE HIGH DOSE: 3 mL | SUBCUTANEOUS | @ 01:00:00 | Stop: 2022-12-04 | NDC 00002021301

## 2022-11-09 MED ADMIN — FLUDROCORTISONE ACETATE 0.1 MG PO TABS: .2 mg | ORAL | @ 04:00:00 | Stop: 2022-12-05 | NDC 50268033011

## 2022-11-09 MED ADMIN — LEVETIRACETAM 1000 MG/100ML RTU: 1000 mg | INTRAVENOUS | @ 04:00:00 | Stop: 2022-11-09 | NDC 44567050201

## 2022-11-09 MED ADMIN — ROCURONIUM BROMIDE 50 MG/5ML IV SOLN: INTRAVENOUS | @ 18:00:00 | Stop: 2022-11-09 | NDC 39822420002

## 2022-11-09 MED ADMIN — INSULIN REGULAR HUMAN 100 UNIT/ML FOR CORRECTIVE HIGH DOSE: 3 mL | SUBCUTANEOUS | @ 07:00:00 | Stop: 2022-11-12 | NDC 00002021301

## 2022-11-09 MED ADMIN — SODIUM CHLORIDE 0.9 % IV SOLN: 50 mL/h | INTRAVENOUS | @ 07:00:00 | Stop: 2022-11-10 | NDC 00338004904

## 2022-11-09 MED ADMIN — DOCUSATE SODIUM 100 MG PO CAPS: 100 mg | ORAL | @ 14:00:00 | Stop: 2022-11-09

## 2022-11-09 MED ADMIN — VANCOMYCIN 1 MG/ML IRRIGATION SOLN: 500 mL | @ 19:00:00 | Stop: 2022-11-10 | NDC 00409433201

## 2022-11-09 MED ADMIN — PLASMA-LYTE A IV SOLN: INTRAVENOUS | @ 19:00:00 | Stop: 2022-11-09 | NDC 00338022104

## 2022-11-09 MED ADMIN — DEXAMETHASONE SOD PHOSPHATE PF 10 MG/ML IJ SOLN: INTRAVENOUS | @ 17:00:00 | Stop: 2022-11-09 | NDC 70069002101

## 2022-11-09 MED ADMIN — CALCIUM CARBONATE ANTACID 500 (200 CA) MG PO CHEW: 1000 mg | ORAL | @ 04:00:00 | Stop: 2022-12-08 | NDC 68084098833

## 2022-11-09 MED ADMIN — ACETAMINOPHEN 10 MG/ML IV SOLN: INTRAVENOUS | @ 22:00:00 | Stop: 2022-11-09 | NDC 63323043400

## 2022-11-09 MED ADMIN — PAPAVERINE HCL 30 MG/ML IJ SOLN: @ 23:00:00 | Stop: 2022-11-10 | NDC 54288014210

## 2022-11-09 MED ADMIN — PHENYLEPHRINE HCL 10 MG/ML IV SOLN (ANES): INTRAVENOUS | @ 17:00:00 | Stop: 2022-11-09

## 2022-11-09 MED ADMIN — LEVETIRACETAM 500 MG/5ML IV SOLN: INTRAVENOUS | @ 17:00:00 | Stop: 2022-11-09 | NDC 00409188602

## 2022-11-09 MED ADMIN — EPHEDRINE SULFATE (PRESSORS) 50 MG/ML IV SOLN: INTRAVENOUS | @ 23:00:00 | Stop: 2022-11-09 | NDC 51754420004

## 2022-11-09 MED ADMIN — REMIFENTANIL HCL 1 MG IV SOLR (ANES): INTRAVENOUS | @ 23:00:00 | Stop: 2022-11-09

## 2022-11-09 MED ADMIN — LACOSAMIDE SYRINGE: INTRAVENOUS | @ 18:00:00 | Stop: 2022-11-09 | NDC 00131181067

## 2022-11-09 MED ADMIN — MANNITOL 25 % IV SOLN: INTRAVENOUS | @ 17:00:00 | Stop: 2022-11-09 | NDC 00409403101

## 2022-11-09 MED ADMIN — HYDROGEN PEROXIDE 3% (118ML) WITH 0.9% NACL (118ML): @ 22:00:00 | Stop: 2022-11-10

## 2022-11-09 MED ADMIN — PHENYLEPHRINE HCL (PRESSORS) 10 MG/ML IV SOLN: INTRAVENOUS | @ 21:00:00 | Stop: 2022-11-09 | NDC 25021031501

## 2022-11-09 MED ADMIN — SODIUM CHLORIDE 1 G PO TABS: 1 g | ORAL | @ 15:00:00 | Stop: 2022-11-10 | NDC 77333084425

## 2022-11-09 MED ADMIN — DOCUSATE SODIUM 100 MG PO CAPS: 100 mg | ORAL | @ 04:00:00 | Stop: 2022-11-09

## 2022-11-09 MED ADMIN — THROMBIN 5,000 UNITS WITH GELFOAM SPONGE 100: @ 19:00:00 | Stop: 2022-11-10

## 2022-11-09 MED ADMIN — PHENYLEPHRINE HCL 10 MG/ML IV SOLN (ANES): INTRAVENOUS | @ 18:00:00 | Stop: 2022-11-09

## 2022-11-09 MED ADMIN — CALCIUM CARBONATE ANTACID 500 (200 CA) MG PO CHEW: 1000 mg | ORAL | @ 14:00:00 | Stop: 2022-11-19

## 2022-11-09 MED ADMIN — REMIFENTANIL HCL 1 MG IV SOLR (ANES): INTRAVENOUS | @ 20:00:00 | Stop: 2022-11-09

## 2022-11-09 MED ADMIN — LIDOCAINE HCL (PF) 1 % IJ SOLN: INTRAVENOUS | @ 18:00:00 | Stop: 2022-11-09 | NDC 63323049237

## 2022-11-09 MED ADMIN — NON FORMULARY: @ 23:00:00 | Stop: 2022-11-10

## 2022-11-09 MED ADMIN — EPHEDRINE SULFATE (PRESSORS) 50 MG/ML IV SOLN: INTRAVENOUS | @ 18:00:00 | Stop: 2022-11-09 | NDC 51754420004

## 2022-11-09 MED ADMIN — PAPAVERINE HCL 30 MG/ML IJ SOLN: @ 20:00:00 | Stop: 2022-11-10 | NDC 54288014210

## 2022-11-09 MED ADMIN — EPHEDRINE SULFATE (PRESSORS) 50 MG/ML IV SOLN: INTRAVENOUS | @ 17:00:00 | Stop: 2022-11-09 | NDC 51754420004

## 2022-11-09 MED ADMIN — SODIUM CHLORIDE 1 G PO TABS: 1 g | ORAL | @ 04:00:00 | Stop: 2022-11-10 | NDC 77333084425

## 2022-11-09 MED ADMIN — FLUDROCORTISONE ACETATE 0.1 MG PO TABS: .2 mg | ORAL | @ 15:00:00 | Stop: 2022-11-10 | NDC 50268033011

## 2022-11-09 MED ADMIN — CALCIUM CHLORIDE 10 % IV SOLN: INTRAVENOUS | @ 22:00:00 | Stop: 2022-11-09 | NDC 76329330401

## 2022-11-09 MED ADMIN — PANTOPRAZOLE SODIUM 40 MG PO TBEC: 40 mg | ORAL | @ 15:00:00 | Stop: 2022-12-08 | NDC 50268063911

## 2022-11-09 MED ADMIN — CEFAZOLIN SODIUM 1 G IJ SOLR: INTRAVENOUS | @ 17:00:00 | Stop: 2022-11-09 | NDC 60505614205

## 2022-11-09 MED ADMIN — PHENYLEPHRINE HCL (PRESSORS) 10 MG/ML IV SOLN: INTRAVENOUS | @ 20:00:00 | Stop: 2022-11-09 | NDC 25021031501

## 2022-11-09 MED ADMIN — NON FORMULARY: @ 19:00:00 | Stop: 2022-11-10

## 2022-11-09 MED ADMIN — CEFAZOLIN SODIUM 1 G IJ SOLR: INTRAVENOUS | @ 21:00:00 | Stop: 2022-11-09 | NDC 60505614205

## 2022-11-09 MED ADMIN — PHENYLEPHRINE HCL 10 MG/ML IV SOLN (ANES): INTRAVENOUS | @ 20:00:00 | Stop: 2022-11-09

## 2022-11-09 MED ADMIN — REMIFENTANIL HCL 1 MG IV SOLR (ANES): INTRAVENOUS | @ 21:00:00 | Stop: 2022-11-09

## 2022-11-09 MED ADMIN — PROPOFOL 200 MG/20ML IV EMUL (ANES): INTRAVENOUS | @ 17:00:00 | Stop: 2022-11-09

## 2022-11-09 MED ADMIN — BUPIVACAINE-EPINEPHRINE (PF) 0.25% -1:200000 IJ SOLN: @ 19:00:00 | Stop: 2022-11-10 | NDC 00409174610

## 2022-11-09 MED ADMIN — BACITRACIN ZINC 500 UNIT/GM EX OINT: @ 19:00:00 | Stop: 2022-11-10 | NDC 51672207501

## 2022-11-09 MED ADMIN — ONDANSETRON HCL 4 MG/2ML IJ SOLN: 4 mg | INTRAVENOUS | @ 23:00:00 | Stop: 2022-12-04 | NDC 60505613005

## 2022-11-09 MED ADMIN — PROPOFOL 200 MG/20ML IV EMUL: INTRAVENOUS | @ 17:00:00 | Stop: 2022-11-09 | NDC 63323026929

## 2022-11-09 MED ADMIN — PHENYLEPHRINE HCL (PRESSORS) 10 MG/ML IV SOLN: INTRAVENOUS | @ 18:00:00 | Stop: 2022-11-09 | NDC 25021031501

## 2022-11-09 MED ADMIN — REMIFENTANIL HCL 1 MG IV SOLR (ANES): INTRAVENOUS | @ 16:00:00 | Stop: 2022-11-09

## 2022-11-09 MED ADMIN — PLASMA-LYTE A IV SOLN: INTRAVENOUS | @ 22:00:00 | Stop: 2022-11-09

## 2022-11-09 MED ADMIN — LEVETIRACETAM 500 MG/5ML IV SOLN: INTRAVENOUS | @ 18:00:00 | Stop: 2022-11-09 | NDC 00409188602

## 2022-11-09 MED ADMIN — DEXAMETHASONE 4 MG PO TABS: 4 mg | ORAL | @ 15:00:00 | Stop: 2022-11-16 | NDC 60687071811

## 2022-11-09 MED ADMIN — REMIFENTANIL HCL 1 MG IV SOLR (ANES): INTRAVENOUS | @ 17:00:00 | Stop: 2022-11-09

## 2022-11-09 MED ADMIN — LEVETIRACETAM 1000 MG/100ML RTU: 1000 mg | INTRAVENOUS | @ 15:00:00 | Stop: 2022-11-09 | NDC 44567050201

## 2022-11-09 MED ADMIN — DEXAMETHASONE 4 MG PO TABS: 4 mg | ORAL | @ 07:00:00 | Stop: 2022-11-10 | NDC 60687071811

## 2022-11-09 MED ADMIN — DEXAMETHASONE SOD PHOSPHATE PF 10 MG/ML IJ SOLN: INTRAVENOUS | @ 19:00:00 | Stop: 2022-11-09 | NDC 70069002101

## 2022-11-09 MED ADMIN — HYDROMORPHONE HCL 2 MG/ML IJ SOLN: INTRAVENOUS | @ 23:00:00 | Stop: 2022-11-09 | NDC 00641615125

## 2022-11-09 NOTE — H&P
UPDATED H&P REQUIREMENT    For [facility]    WHAT IS THE STATUS OF THE PATIENT'S MOST CURRENT HISTORY AND PHYSICAL?   - The most current H&P is >24 hours and <30 days, and having examined the patient, I attest that there have been no changes. (This suffices as an update to the H&P).      REFER TO MEDICAL STAFF POLICIES REGARDING PRE-PROCEDURE HISTORY AND PHYSICAL EXAMINATION AND UPDATED H&P REQUIREMENTS BELOW:    [facility] and [facility] Medical Staff Policy 200 - For Patients Undergoing Procedures Requiring Moderate or Deep Sedation, General Anesthesia or Regional Anesthesia    Contents of a History and Physical Examination (H&P):    The H&P shall consist of chief complaint, history of present illness, allergies and medications, relevant social and family history, past medical history, review of systems and physical examination, and assessment and plan appropriate to the patient's age.    For Patients Undergoing Procedures Requiring Moderate or Deep Sedation, General Anesthesia or Regional Anesthesia:    1. An H&P shall be performed within 24 hours prior to the procedure by a qualified member of the medical staff or designee with appropriate privileges, except as noted in item 2 below.    2. If a complete history and physical was performed within thirty (30) calendar days prior to the patient's admission to the Medical Center for elective surgery, a member of the medical staff assumes the responsibility for the accuracy of the clinical information and will need to document in the medical record within twenty-four (24) hours of admission and prior to surgery or major invasive procedure, that they either attest that the history and physical has been reviewed and accepted, or document an update of the original history and physical relevant to the patient's current  clinical status.    3. Providing an H&P for patients undergoing surgery under local anesthesia is at the discretion of the Attending Physician.     4. When a procedure is performed by a dentist, podiatrist or other practitioner who is not privileged to perform an H&P, the anesthesiologist's assessment immediately prior to the procedure will constitute the 24 hour re-assessment.The dentist, podiatrist or other practitioner who is not privileged to perform an H&P will document the history and physical relevant to the procedure.    5. If the H&P and the written informed consent for the surgery or procedure are not recorded in the patient's medical record prior to surgery, the operation shall not be performed unless the attending physician states in writing that such a delay could lead to an adverse event or irreversible damage to the patient.    6. The above requirements shall not preclude the rendering of emergency medical or surgical care to a patient in dire circumstances.

## 2022-11-09 NOTE — Other
Patient's Clinical Goal:   Clinical Goal(s) for the Shift: VSS, safety, comfort  Identify possible barriers to advancing the care plan: Rescheduling of surgery  Stability of the patient: Moderately Unstable - medium risk of patient condition declining or worsening    Progression of Patient's Clinical Goal:  Alert and oriented x 4.  Dx of brain mass and hx of chronic back pain (kyphosis), GERD. Ambulates with a cane, gait slow and steady.  Continue on IV PB Keppra and oral Dexamethasone No seizure activity noted. Blood glucose now checked Q 6 hours since diet status now NPO. Scheduled for OR today for [facility] Resection.  Na level 136 and within parameter (135-145).

## 2022-11-09 NOTE — Other
Patient's Clinical Goal:   Clinical Goal(s) for the Shift: VSS, safety, comfort  Identify possible barriers to advancing the care plan:   Stability of the patient: Moderately Stable - low risk of patient condition declining or worsening   Progression of Patient's Clinical Goal:   VSS, no acute distress noted. Funtional MRI completed. Pending OR tomorrow 11/09/22 as add on for 0830. SBP & NA within goal. All needs addressed/met, call light within reach.

## 2022-11-09 NOTE — Progress Notes
NEUROSURGERY PROGRESS NOTE     DATE OF SERVICE:  11/09/2022    PATIENT: [name]        DOB: 1946/04/19    ATTENDING PHYSICIAN:  Ellyn Hack., MD    ADMISSION DATE:  11/04/2022    LENGTH OF STAY:  LOS: 5 days     CHIEF COMPLAINT: brain tumor     ACTIVE MEDICAL PROBLEMS:   Patient Active Problem List   Diagnosis    Brain mass       Present on Admission:   Brain mass      HPI:   [name] is a 77 y.o. year old female not on AC/AP who presents with 2 weeks of progressive speech difficulty that she describes as 'the words coming out wrong' as well as lethargy. Language problems are equally present when speaking in [place] (first language), [place]., and [place]. Denies any memory problems other than what she considers normal aging. Pt reports that she is otherwise in her usual state of health and denies any seizures, confusion, receptive language difficulty, sensory deficits, or gross motor deficits. Otherwise, she is very active and functional at baseline. 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 She has no known hx of systemic canc       INTERVAL EVENTS:   3/30 Consulted [facility] for pre operative clearance. Pending OR for 11/09/22 with Dr. Allena Katz .   3/31: Na 132 (134). Discont 75cc NaCl. 0.2mg  bid Florinef. 1g salt tab bid today. Neurointact at bedside including language. Sitting bedside, eating, walking. Plan for OR 4/3. Cleared IMCS.  4/1: AFVSS. NAEON. Na 134 (135). Florinef, salt tabs, 3% 12.5cc/h. Dex/Keppra. OR Weds 4/3.    OBJECTIVE:     Vital Signs:   Temp:  [36 ?C (96.8 ?F)-37 ?C (98.6 ?F)] 36.5 ?C (97.7 ?F)  Heart Rate:  [54-80] 75  Resp:  [15-19] 19  BP: (123-156)/(53-71) 150/69  NBP Mean:  [72-94] 93  SpO2:  [93 %-98 %] 94 %  On RA    GENERAL: Comfortable, breathing comfortably, resting in bed in no apparent distress    NEURO EXAM:  AAOx3  +comprehension + fluency  PERRL. EOMI. Visual fields intact.  Face symmetric. Hearing intact bilaterally. Tongue midline.  Following commands x4.  Motor: 5/5 throughout.  No pronator drift.  Sensation grossly intact  Incision:     Chest: symmetrical chest rise, CTA in all lobes, NSR, (-)murmur  Vascular: 2+ pulses throughout (-)edema, cyanosis  ABD: soft, nontender, flat, normoactive bowel sounds      LINES AND DRAINS  CENTRAL LINE: None  DRAINS: None  FOLEY: None  SUTURE/STAPLE: 2 weeks post-op    DATA   Pre-operative surgical visit and medical clearance visit documentation obtained and reviewed. Clinical lab tests reviewed daily. All associated imaging for this patient was independently reviewed and interpreted as listed below:    Post-op imaging:   11/04/22 CT brain outside imaging   1. Large mixed density mass centered in the left anterior and mid temporal lobe with moderate perilesional vasogenic edema, regional mass effect and midline shift from left to right at the level of septum pellucidum of 6 mm. 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  2. There is no evidence of subfalcine or uncal herniation.  3. No hydrocephalus.      ASSESSMENT AND PLAN:       NEURO  Brain tumor  Subacute aphasia (POA )  Hyponatremia (goal 135-145)  discont NaCl  75cc/h  cont 0.2 Flurinef bid  salt tabs 1g bid 3/31 am  3% NaCl 12.5cc/h 4/1  Vasogenic edema (POA)   AED: Keppra 1000mg  po  bid  Steroid: Dexamethasone 4mg  Q8 PO  Florinef 0.2mg  po bid x7 days   Suture/staple removal on POD#   Surgery scheduled for 11/09/22   3/30 [facility] consulted for Preop clearance. Appreciate recs    Cleared for OR per [facility]  3/31 Repeat NA in am       PAIN  No active issues  Tylenol and oxycodone PRN    RESPIRATORY  No active issues  Incentive spirometry      CARDIOVASCULAR  Hypertension  Bradycardia   Goal SBP 90-160  Keep K >4 and Mg >2  DC propranolol per IMCS    GU / RENAL  No active issues  Continue to monitor UOP    ID  Leukocytosis, afebrile 2/2 dexamethasone  Antibiotics: None   Continue to monitor WBC     HEME / ONC  No active issues  Keep Hgb >7  Continue to monitor H&H    ENDOCRINE  Hyperglycemia  Glucose goal 100-140   Glycemic control with SSI    GI / HEPATIC  No active issues  Diet: Regular   Nausea management: zofran PRN  Bowel regimen  Ulcer prophylaxis    PPx  DVT prophylaxis: SQH started today,   SCDs      Disposition  PT/OT/Speech Recs:    The patient was examined and discussed with the resident Neurosurgery team & Attending Ellyn Hack., MD who agrees with assessment and plan listed above.    Author:    Pryor Ochoa, MD   7:07 AM 11/09/2022  Department of Neurological Surgery    I have evaluated this patient with the inpatient team.     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     Admit to inpatient  AED and dexamethasone  OR as inpatient.  I discussed risks benefits alternatives to surgery. We will plan for surgery for oncologic management of brain tumor    Neurosurgery Pre-operative Planning Note    Procedure: L temporal craniotomy for brain tumor    Pre-operative Planning    Positioning:  []  Supine [x]  Supine + bump []  Lateral, L up  []  Lateral, R up  []  Prone   []  Pins [x]  Horseshoe []  Head of bed  [x]  Minimal shave []  Complete head shave    Image Guidance  [x]  BrainLAB 4 Star []  BrainLAB 3 Star []  Stealth Axiom    Monitoring  []  No Monitoring []  MEP [x]  SSEP [x]  EEG []  CN Monitoring []  Motor strip mapping []  Awake stimulation    Anesthesia Surgical Considerations:  [x]  Ancef []  Clindamycin []  Vancomycin  [x]  Keppra [x]  Lacosamide []  Pre-existing AEDs  [x]  Dexamethasone [x]  PPI  [x]  Mannitol 1g/kg []  Mannitol 0.5g/kg  []  Scalp block []  Sumatriptan []  Toradol  [x]  Arterial Line []  CVC []  Precordial Doppler    Nursing Surgical Considerations:  Length of Case 6 hr  []  6N non-monitored [x]  6N monitored []  6ICU  []  T&S only [x]  2U in fridge []  2U in room  []  Frozen [x]  Research []  Culture    Scrub Surgical Considerations  [x]  Craniotomy Tray [x]  Greenberg []  Universal Screw Set [x]  Fish Hooks  [x]  Drill [x]  BK Ultrasound [x]  Microscope (Tumor) [x]  Spetzler Bipolar 1.0 x 18  []  Cotton ball 1:1 hydrogen peroxide [  x] Surgicel []  Suturable Dura-repair  []  Tachosil [x]  3L antibiotic irrigation [x]  Head wrap    []  TNTS Set up []  Fat graft Set up []  Foot board []  Lumbar Drain Set up    []  Ventriculostomy Catheter []  57fr JP drain  []  Shunt proximal catheter []  Shunt distal catheter []  Shunt valve    Post-op Surgical Considerations  [x]  Keppra [x]  Lacosamide []  Continue pre-existing AEDs  [x]  Dexamethasone taper []  PPI  [x]  Ancef []  Clindamycin  []  Diamox []  EVD  []  JP drain bulb suction   []  HOB flat []  HOB 30 [x]  HOB 60  []  CT wo [x]  MRI w/wo []  MRI CEST    Consent Documentation  Consent acquired from: patient    I discussed the  1) typical operation and some possible common contingencies  2) benefits of the procedure  3) risks of the procedure including: bleeding (either during or after the procedure) (<5%), infection (<5%), seizure (<5%), stroke (<5%), hydrocephalus (<5%), CSF leak (<5%), and failure of the operation to achieve the desired result. I discussed that these risks may lead to neurologic deficits, sensory changes, weakness, or paralysis and in rare cases can lead to coma/death. I also discussed that these risks may necessitate the need for reoperation.   4) alternatives to the procedure        Kunal S. Allena Katz  Assistant Professor  Department of Neurosurgery  [employer]  [place] [employer] [place]  [phone]

## 2022-11-09 NOTE — Nursing Note
Patient sent down to OR.

## 2022-11-09 NOTE — Brief Op Note
Brief Operative/Procedure Note    Patient: [name]    Date of Operation(s)/Procedure(s): 11/09/2022    Pre-op Diagnosis: Brain tumor       Post-op Diagnosis: same    Operation(s)/Procedure(s):  LEFT CRANIOTOMY FOR TUMOR RESECTION    Surgeon(s) and Role:     Ellyn Hack., MD - Primary     * Marga Hoots, MD - Surgeon 1st Assist - Resident     * Dirisio, Spero Geralds., MD - Surgeon Assistant - Resident    Anesthesia Staff and Role:  Anesthesia Resident: Olga Coaster., MD  Anesthesiologist: Lavone Neri., MD    Anesthesia Type:   General    Pre-Op Medications: Ancef, mannitol, decadron, vimpat, keppra    Intra-op Medications: (Antibiotics, Anticoagulants, Immunosuppressants)  Medication Name Total Dose   bacitracin 500 unit/g ointment 1 tube   BUPivacaine-EPINEPHrine PF 0.25%-1:200000 inj 20 mL   THROMBIN 5,000 UNITS WITH GELFOAM SPONGE 100 1 each   vancomycin (Vancocin) 500 mg in sodium chloride 0.9% 500 mL 1 mg/mL irrigation soln 2,266.67 mL   non formulary 4,700 mL   non formulary 8 mL   papaverine 30 mg/mL inj 2 mg   HYDROGEN PEROXIDE 3% ( ) WITH 0.9% NACL ( ) 200 mL   papaverine 30 mg/mL inj 1 mL   bisacodyl supp 10 mg Cannot be calculated   calcium carbonate chew tab 1,000 mg 3,000 mg   calcium gluconate 2 g in sodium chloride 0.9% 50 mL IVPB Cannot be calculated   dexAMETHasone tab 4 mg 64 mg   fludrocortisone tab 0.2 mg 1.4 mg   magnesium hydroxide 400 mg/5 mL susp 30 mL Cannot be calculated   ondansetron 4 mg/2 mL inj 4 mg Cannot be calculated   ondansetron tab 4 mg Cannot be calculated   pantoprazole DR tab 40 mg 80 mg   sodium chloride 0.9% IV soln 760.83 mL   sodium chloride tab 1 g 7 g   sodium phosphate (Fleet) ADULT enema 1 enema Cannot be calculated   docusate cap 100 mg 600 mg   labetalol 5 mg/mL inj 5 mg Cannot be calculated   levETIRAcetam 1 g in sodium chloride 100 mL IVPB RTU 2,966.67 mL       Blood Products:     Fluids:     Estimated Blood Loss: less than 100 mL    Findings: L temporal tumor    Complications: None; patient tolerated the operation(s)/procedure(s) well.                 Specimens:   ID Type Source Tests Collected by Time   1 : LEFT TEMPORAL TUMOR Tissue Tumor TISSUE EXAM/FOREIGN BODY (AP) Ellyn Hack., MD 11/09/2022 1421   2 : KP 1 Tissue Tumor TISSUE EXAM/FOREIGN BODY (AP) Ellyn Hack., MD 11/09/2022 1423   3 : KP 2 Tissue Tumor TISSUE EXAM/FOREIGN BODY (AP) Ellyn Hack., MD 11/09/2022 1423   4 : KP 3 Tissue Tumor TISSUE EXAM/FOREIGN BODY (AP) Ellyn Hack., MD 11/09/2022 1424       Drains:   Urethral Catheter Latex;Standard 16 Fr. (Active)            Staff and Role:   Circulating Nurse: Lyda Kalata, RN; Noblett, Ernest Mallick, RN; Acquanetta Belling, RN  Scrub Person: Noblett, Ernest Mallick, RN; Glennon Hamilton, RN  Neurophysiology Tech: Berneice Gandy; Argentina Donovan  Chaperone: Glennon Hamilton, RN    Marga Hoots, MD    Date:  11/09/2022  Time: 4:33 PM

## 2022-11-09 NOTE — Consults
IP CM ACTIVE DISCHARGE PLANNING  Department of Care Coordination      Admit [phone]  Anticipated Date of Discharge: 11/11/2022    Following HW:YSHUO, Marquis Buggy., MD      Today's short update     IDR 04/03:  OR today for tumor resection    Disposition     Pending plan  Home Address: [address]  [place] [address]  Family/Support System in agreement with the current discharge plan: Yes, in agreement and participating    Other Arrangements (if applicable)     Comments: Assigned CM will continue to re-assess safe discharge plan based on primary medical and interdisciplinary team recommendation(s).     Gillian Scarce RN, BSN  Covering Clinical Case Manager   Neurosurgery ~ [facility]  Tel: [phone]   ~   Pager: [phone]    Department of Care Coordination Main Office:   Phone: [phone]   ~ Fax: [phone]    For Assistance After Hours:   Weekdays Swing Shift : Pager [phone]  ED CM: [phone] ~ Pager [phone]   Weekends: [phone] ~ Pager [phone]

## 2022-11-10 LAB — Basic Metabolic Panel
ANION GAP: 8 mmol/L (ref 8–19)
SODIUM: 139 mmol/L (ref 135–146)
TOTAL CO2: 26 mmol/L (ref 20–30)
TOTAL CO2: 26 mmol/L (ref 20–30)

## 2022-11-10 LAB — CBC
MEAN PLATELET VOLUME: 9.4 fL (ref 9.3–13.0)
MEAN PLATELET VOLUME: 9.4 fL (ref 9.3–13.0)

## 2022-11-10 LAB — Glucose,POC: GLUCOSE,POC: 141 mg/dL — ABNORMAL HIGH (ref 65–99)

## 2022-11-10 LAB — Prothrombin Time Panel
INR: 1.1 s (ref 11.5–14.4)
PROTHROMBIN TIME: 14.8 s — ABNORMAL HIGH (ref 11.5–14.4)

## 2022-11-10 LAB — APTT: APTT: <24 s — ABNORMAL LOW (ref 24.4–36.2)

## 2022-11-10 LAB — Magnesium
MAGNESIUM: 2 meq/L — ABNORMAL HIGH (ref 1.4–1.9)
MAGNESIUM: 1.9 meq/L (ref 1.4–1.9)

## 2022-11-10 LAB — Phosphorus: PHOSPHORUS: 2.9 mg/dL (ref 2.3–4.4)

## 2022-11-10 LAB — Sodium: SODIUM: 135 mmol/L (ref 135–146)

## 2022-11-10 LAB — Calcium,Ionized: IONIZED CA++,CORRECTED: 1.23 mmol/L (ref 1.09–1.29)

## 2022-11-10 MED ADMIN — LEVETIRACETAM 1000 MG/100ML RTU: 1000 mg | INTRAVENOUS | @ 18:00:00 | Stop: 2022-11-10 | NDC 44567050201

## 2022-11-10 MED ADMIN — LACOSAMIDE 100 MG PO TABS: 100 mg | ORAL | @ 06:00:00 | Stop: 2022-11-11 | NDC 60687068711

## 2022-11-10 MED ADMIN — CEFAZOLIN SODIUM-DEXTROSE 1-4 GM/50ML-% IV SOLN: 1 g | INTRAVENOUS | @ 23:00:00 | Stop: 2022-11-10 | NDC 00338350341

## 2022-11-10 MED ADMIN — CEFAZOLIN SODIUM-DEXTROSE 1-4 GM/50ML-% IV SOLN: 1 g | INTRAVENOUS | @ 07:00:00 | Stop: 2022-11-10 | NDC 00338350341

## 2022-11-10 MED ADMIN — CALCIUM CARBONATE ANTACID 500 (200 CA) MG PO CHEW: 1000 mg | ORAL | @ 16:00:00 | Stop: 2022-12-08 | NDC 68084098833

## 2022-11-10 MED ADMIN — SODIUM CHLORIDE 0.9 % IV SOLN: 75 mL/h | INTRAVENOUS | @ 23:00:00 | Stop: 2022-11-12 | NDC 00338004904

## 2022-11-10 MED ADMIN — SODIUM CHLORIDE 1 G PO TABS: 1 g | ORAL | @ 06:00:00 | Stop: 2022-11-10 | NDC 77333084425

## 2022-11-10 MED ADMIN — FLUDROCORTISONE ACETATE 0.1 MG PO TABS: .2 mg | ORAL | @ 06:00:00 | Stop: 2022-11-10 | NDC 50268033011

## 2022-11-10 MED ADMIN — OXYCODONE HCL 5 MG PO TABS: 5 mg | ORAL | @ 06:00:00 | Stop: 2022-11-17 | NDC 68084035411

## 2022-11-10 MED ADMIN — PSYLLIUM PO PACK (MULTI-GPI): 1 | ORAL | @ 07:00:00 | Stop: 2022-12-10

## 2022-11-10 MED ADMIN — SODIUM CHLORIDE 0.9 % IV SOLN: 75 mL/h | INTRAVENOUS | @ 07:00:00 | Stop: 2022-11-12 | NDC 00338004904

## 2022-11-10 MED ADMIN — DOCUSATE SODIUM 100 MG PO CAPS: 100 mg | ORAL | @ 07:00:00 | Stop: 2022-12-10

## 2022-11-10 MED ADMIN — ACETAMINOPHEN 325 MG PO TABS: 650 mg | ORAL | @ 16:00:00 | Stop: 2022-11-11 | NDC 71399801401

## 2022-11-10 MED ADMIN — DEXAMETHASONE 4 MG PO TABS: 4 mg | ORAL | @ 11:00:00 | Stop: 2022-11-10

## 2022-11-10 MED ADMIN — LEVETIRACETAM 1000 MG/100ML RTU: 1000 mg | INTRAVENOUS | @ 07:00:00 | Stop: 2022-11-10 | NDC 44567050201

## 2022-11-10 MED ADMIN — DEXAMETHASONE IVPB: 6 mg | INTRAVENOUS | @ 18:00:00 | Stop: 2022-11-12 | NDC 67457042254

## 2022-11-10 MED ADMIN — DOCUSATE SODIUM 100 MG PO CAPS: 100 mg | ORAL | @ 16:00:00 | Stop: 2022-12-10 | NDC 00904718361

## 2022-11-10 MED ADMIN — PSYLLIUM PO PACK (MULTI-GPI): 1 | ORAL | @ 18:00:00 | Stop: 2022-12-10

## 2022-11-10 MED ADMIN — CEFAZOLIN SODIUM-DEXTROSE 1-4 GM/50ML-% IV SOLN: 1 g | INTRAVENOUS | @ 16:00:00 | Stop: 2022-11-10 | NDC 00338350341

## 2022-11-10 MED ADMIN — DEXAMETHASONE IVPB: 8 mg | INTRAVENOUS | @ 07:00:00 | Stop: 2022-11-10 | NDC 67457042254

## 2022-11-10 MED ADMIN — PANTOPRAZOLE SODIUM 40 MG PO TBEC: 40 mg | ORAL | @ 16:00:00 | Stop: 2022-12-08 | NDC 50268063911

## 2022-11-10 MED ADMIN — OXYCODONE HCL 5 MG PO TABS: 5 mg | ORAL | @ 16:00:00 | Stop: 2022-11-17 | NDC 68084035411

## 2022-11-10 MED ADMIN — LACOSAMIDE 100 MG PO TABS: 100 mg | ORAL | @ 16:00:00 | Stop: 2022-11-11 | NDC 60687068711

## 2022-11-10 MED ADMIN — DEXAMETHASONE IVPB: 6 mg | INTRAVENOUS | @ 23:00:00 | Stop: 2022-11-12 | NDC 67457042254

## 2022-11-10 NOTE — Consults
Physical Therapy Evaluation      PATIENT: [name]  MRN: [id number]  DOB: 12-Apr-1946    ADMIT DATE: 11/04/2022     Date of Evaluation: 11/10/2022    Problems: Principal Problem:    Brain mass (POA: Yes)       Past Medical History:   Diagnosis Date    Arthritis     Hypertension     Past Surgical History:   Procedure Laterality Date    FRACTURE SURGERY      JOINT REPLACEMENT          Relevant Hospital Course: 77 y.o. F presenting to an OSH w/ word finding difficulty, mild RUE weakness, & lethargy. 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 Transferred to [facility] for HLOC on 11/04/22. Now s/p L frontal craniotomy for resection of brain tumor on 11/09/22.    Patient Stated Goal: To get OOB     Living Arrangements   Type of Home: House  Home Layout: One level  Bathroom Shower/Tub: Tub/shower unit, Walk-in shower (but uses the tub/shower @ baseline)  Bathroom Equipment: Grab bars in shower, Hand-held shower  Home Equipment: Cane  Additional Comments: info obtain from pt's daughter at bedside. per daughter, pt would occasionally use SPC when pt back pain worsens    Prior Level of Function   Level of Independence: Independent, Community ambulation  Lives With: Spouse  Support Available: Family  # of hours available: 24hr/day supervision & limited physical assistance from husband. Daughter stating that pt's husband is unable to drive & that his English is limited.  ADL Assistance: Independent, Activities of Daily Living  Homemaking Assistance: Independent  Vision: Glasses  Hearing: Within Functional Limits  Transportation: Drives Self    Precautions   Precautions: Fall risk;Craniotomy;Monitor Vitals;Check Labs (SBP 90-160)  Orthotic: None  Current Activity Order: Order implies OOB  Weight Bearing Status: Not Applicable    GENERAL EVALUATION   Position: In bed;Bed alarm on;SCD's  Lines/devices Drains: IV/PICC;Cardiac Monitor;Pulse Ox     Bed Mobility   Supine to Sit: Minimum Assist;Verbal Cueing;Side Rails;to Left    Functional Mobility   Sit to Stand: Minimum Assist;Verbal Cueing  Ambulation: Minimum Assist;Verbal Cueing  Ambulation Distance (Feet): 72ft  Gait Pattern: Decreased weight shift;Decreased stride length;Decreased pace;Narrow Base of Support;Decreased heel-toe  Assistive Device: Right;Straight cane;Left;IV pole     Balance   Sitting - Static: with UE support (fair+)  Sitting - Dynamic: Able to weight shift anteriorly;Able to weight shift posteriorly;Able to weight shift laterally;Able to weight shift alternating sides;with UE support  Standing - Static: Requires MIN A to maintain;with UE support  Standing - Dynamic: LOB without self-correction;Requires MIN A to recover;with UE support    LE Assessment   RLE Assessment: Gross Assessment  grossly 4/5 throughout      LLE Assessment: Gross Assessment  grossly 4/5 throughout      Sensation   Sensation: Grossly intact    Cognition   Cognition: Exceptions to WDL  Arousal/Alertness: Appropriate responses to stimuli  Attention Span: Attends with cues to redirect;Difficulty attending to directions  Memory: Decreased recall of precautions  Orientation Level: Oriented to person;Oriented to place  Following Commands: Follows one step commands with increased time;Follows one step commands with repetition  Safety Awareness: Poor awareness of safety precautions;Decreased awareness of need for assistance;Impulsive  Barriers to Learning: Cognitive Limitations;Altered Mental Status;Language (daughter assisting with Portugese interpretation per pt/family preference)    Neurological Evaluation (if indicated)   Neuro  Deficits: Yes  Inattention/Neglect: Appears Intact  Midline Orientation: MIN A to maintain midline;In sitting  Motor Planning: Cues to initiate activity;Assist to initiate activity  Motor Control: Grossly intact all extremities  Coordination: Decreased speed;Decreased accuracy  Perseveration: Not present  RUE Tone: Within Functional Limits  LUE Tone: Within Functional Limits  RLE Tone: Within Functional Limits  LLE Tone: Within Functional Limits  Head Control and Alignment: Within Functional Limits  Trunk Control: Within Functional Limits    Neuro Re-education Activities: Tolerance to upright position    Pain Assessment   Patient complains of pain: No    Exercises  Ankle Pumps: Active;Bilateral;<5 Reps;Seated    Patient Status   Activity Tolerance: Good  Oxygen Needs: Room Air  Response to Treatment: Tolerated treatment well;Vital signs stable;Nursing notified  Compliance with Precautions: Fair  Call light in reach: Yes  Presentation post treatment: Up in chair;On cardiac monitor;Lines/drains intact;Pulse Ox;Body holder on;Chair alarm on;Family/CG present  Comments: Pt educated on craniotomy precautions and handout given. Pt presents with poor balance and impulsive; frequent VC needed to slow down for safety. Performed gait training with SPC, however pt also needing to hold onto IV pole for balance. Pt would benefit from using FWW. Pt will benefit from continued PT.    Interdisciplinary Communication   Interdisciplinary Communication: Nurse;Physician;Occupational Therapist    ASSESSMENT   Rehab Potential: Good  Inpatient Recommendation: PT treatment  Problem List: Decreased bed mobility;Decreased transfers;Decreased gait;Decreased strength;Decreased activity tolerance;Impaired balance;Fall risk;Decreased knowledge of precautions;Decreased knowledge of exercise program;Need for caregiver/family education;Discharge needs  Treatment Plan: Bed mobility training;Transfer training;Gait training;Therapeutic exercise;Balance training;Patient and/or family education;Caregiver education;Discharge planning;Education on precautions;Home program;Neuromuscular re-education  Frequency: 3-5 x/week  Duration (days): 30  Progress Note Due Date: 11/17/22    Goals Discussed With: Patient;Family    Short Term Goals to be achieved in: 7 days  Pt will perform supine to sit: with contact guard assist  Pt will perform sit to stand: with contact guard assist;with FWW  Pt will ambulate: 51-100 feet;with FWW;with contact guard assist    Long Term Goals to be achieved in: 30 days  Pt will be: assisted with functional mobility with verbal cues for safety and appropriate assistive device    PT Recommendations   Discharge Recommendation: Physical Therapy;Would benefit from intensive therapy upon discharge  Discharge concerns: Requires assistance for mobility;Requires assistance for self care  Discharge Equipment Recommended: Defer to discharge facility;If patient dc home instead of rehab as recommended;Walker    AM-PAC   AM-PAC Basic Mobility t-Scale Score: 39.67  AM-PAC Basic Mobility CMS 'G Code' Modifier: CK  AM-PAC Basic Mobility Raw Score: 17    Evaluation Completed by: Aldean Ast, PT,  11/10/2022

## 2022-11-10 NOTE — Progress Notes
NEUROSURGERY PROGRESS NOTE     DATE OF SERVICE:  11/10/2022    PATIENT: [name]        DOB: 03-13-1946    ATTENDING PHYSICIAN:  Ellyn Hack., MD    ADMISSION DATE:  11/04/2022    LENGTH OF STAY:  LOS: 6 days     CHIEF COMPLAINT: brain tumor     ACTIVE MEDICAL PROBLEMS:   Patient Active Problem List   Diagnosis    Brain mass       Present on Admission:   Brain mass      HPI:   [name] is a 77 y.o. year old female not on AC/AP who presents with 2 weeks of progressive speech difficulty that she describes as 'the words coming out wrong' as well as lethargy. Language problems are equally present when speaking in [place] (first language), [place]., and [place]. Denies any memory problems other than what she considers normal aging. Pt reports that she is otherwise in her usual state of health and denies any seizures, confusion, receptive language difficulty, sensory deficits, or gross motor deficits. Otherwise, she is very active and functional at baseline. 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 She has no known hx of systemic canc       INTERVAL EVENTS:   3/30 Consulted [facility] for pre operative clearance. Pending OR for 11/09/22 with Dr. Allena Katz .   3/31: Na 132 (134). Discont 75cc NaCl. 0.2mg  bid Florinef. 1g salt tab bid today. Neurointact at bedside including language. Sitting bedside, eating, walking. Plan for OR 4/3. Cleared IMCS.  4/1: AFVSS. NAEON. Na 134 (135). Florinef, salt tabs, 3% 12.5cc/h. Dex/Keppra. OR Weds 4/3.  4/2: AFVSS. NAEON. Na 136 (135). Florinef, salt tabs, 3% 12.5cc/h. Dex/Keppra. OR Weds 4/3. Functional MRI scheduled for 6 am.  3%NaCL stopped  NPO P MN/IVF    4/4 POD1 s/p L temporal crani for tumor; MRI pending. NAEON, AF VSS. WBC 34.87 (47.52), Hb 10.5 (11). Na 139. Foley. PT/OT.    OBJECTIVE:     Vital Signs:   Temp:  [36 ?C (96.8 ?F)-37.4 ?C (99.3 ?F)] 36.3 ?C (97.3 ?F)  Heart Rate:  [54-80] 60  Resp:  [12-23] 21  BP: (110-144)/(59-85) 120/67  NBP Mean: [73-91] 80  Arterial Line BP (mmHg): (97-174)/(81-86) 97/86  MAP:  [93 mmHg-110 mmHg] 93 mmHg  SpO2:  [93 %-100 %] 97 %  On RA    GENERAL: Comfortable, breathing comfortably, resting in bed in no apparent distress    NEURO EXAM:  EOS  AO1 y/n  PERRL  English: Naming 0/3 (watch for clock), no repetition, word salad  Fcx4  Full strength    Chest: symmetrical chest rise, CTA in all lobes, NSR, (-)murmur  Vascular: 2+ pulses throughout (-)edema, cyanosis  ABD: soft, nontender, flat, normoactive bowel sounds LBM 4/1  Voiding    LINES AND DRAINS  CENTRAL LINE: None  DRAINS: None  FOLEY: None  SUTURE/STAPLE: 2 weeks post-op (4/17)    DATA   Pre-operative surgical visit and medical clearance visit documentation obtained and reviewed. Clinical lab tests reviewed daily. All associated imaging for this patient was independently reviewed and interpreted as listed below:    Post-op imaging:     11/04/22 CT brain outside imaging   1. Large mixed density mass centered in the left anterior and mid temporal lobe with moderate perilesional vasogenic edema, regional mass effect and midline shift from left to right at the level of septum pellucidum  of 6 mm. 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  2. There is no evidence of subfalcine or uncal herniation.  3. No hydrocephalus.      ASSESSMENT AND PLAN:   77F admitted to inpatient pending OR, craniotomy for tumor resection.    NEURO  # Brain tumor  # Subacute aphasia (POA )  -S/p L temporal crani, tumor rxn  -Hyponatremia (goal 135-145)  discont NaCl 75cc/h  cont 0.2 Flurinef bid  salt tabs 1g bid 3/31 am  3% NaCl 12.5cc/h 4/1 > stopped 4/2  -Sutures: POD14, 4/18  [/] Post-op MRI brain w/wo    Vasogenic edema (POA)   AED: Keppra 1000mg  po bid x7d, vimpat 100 mg bid for 2 weeks (4/17)  Steroid: Dexamethasone 6mg  Q8 PO to 4 mg  Florinef 0.2mg  po bid x7 days   Suture/staple removal on POD#14   3/30 IMCS consulted for Preop clearance. Appreciate recs    Cleared for OR per IMCS    PAIN  No active issues  Tylenol and oxycodone PRN    RESPIRATORY  No active issues  Incentive spirometry    CARDIOVASCULAR  Hypertension  Bradycardia   Goal SBP 90-160  Keep K >4 and Mg >2  DC propranolol per IMCS    GU / RENAL  No active issues  Continue to monitor UOP    ID  Leukocytosis, afebrile 2/2 dexamethasone  Antibiotics: None   Continue to monitor WBC     HEME / ONC  No active issues  Keep Hgb >7  Continue to monitor H&H    ENDOCRINE  Hyperglycemia  Glucose goal 100-140   Glycemic control with SSI    GI / HEPATIC  No active issues  Diet: Regular > NPO P MN with IVF 50cc/hr   Nausea management: zofran PRN  Bowel regimen  Ulcer prophylaxis    PPx  DVT prophylaxis: SQH started today,   SCDs    Disposition  PT/OT/Speech Recs: pending postop eval    The patient was examined and discussed with the resident Neurosurgery team & Attending Ellyn Hack., MD who agrees with assessment and plan listed above.    Author:    Pryor Ochoa, MD   5:27 AM 11/10/2022  Department of Neurological Surgery

## 2022-11-10 NOTE — Consults
CLINICAL SOCIAL WORKER PROGRESS NOTE      Admit Date:11/04/2022    Date of CSW Assessment: 11/10/2022     REFERRAL INFORMATION:   Date Seen: 11/10/22  Date Referred: 11/10/22  Referral Source: RN  Reason for Referral: Transportation/Parking Assistance  Source of Information: Review of medical record  Does the patient have a Family/Support System member participating in Discharge Planning?: Yes  Family/Support System in agreement with the current discharge plan: Yes, in agreement and participating    CLINICAL ASSESSMENT:     Responding to automated RN Consult for transportation. Chart reviewed. Per CM assessment completed 11/08/22, pt has family transport upon dc. CSW discussed with CM, no needs at this time, pt awaiting PT/OT eval. No transport needs at this time, should needs change, please place new SW consult. CSW will remain available.    Interventions: Gathered history, Coordinated care with multidisciplinary team  Outcome: Chart reviewed.  Plan/Recommendations: Discharge to Home    Projected Date of Discharge: 11/11/2022    SDOH INTERVENTIONS:        ___________________________  Dennard Schaumann, LCSW  Licensed Clinical Social Worker  Department of Care Coordination and Clinical Social Work  [facility]  Pager: [phone]

## 2022-11-10 NOTE — Progress Notes
Speech Language Pathology      PATIENT: [name]   Date: 11/10/2022  Time: 1:00 PM      Service Provided     SLP Cognitive Communication Evaluation was attempted, but pt was eating lunch. Per daughter, pt c/o heartburn. RN was notified of this report. SLP will re-attempt evaluation later this afternoon.      [address] [place], [place]   [id number]  [id number]

## 2022-11-10 NOTE — Op Note
Operative Note    Patient: [name]  MRN: [id number]  Date of Birth: Jan 06, 1946    Date of Surgery: 11/09/22    Pre-operative Diagnosis: Brain Tumor  Post-operative Diagnosis: Brain Tumor  Title of Operation:   Left frontal craniotomy for resection of brain tumor  Use if intraoperative neuronavigation, neuromonitoring, microscope  Added complexity    Surgeon(s) and Role:     Ellyn Hack., MD - Primary     * Marga Hoots, MD - Surgeon 1st Assist - Resident     * Dirisio, Spero Geralds., MD - Surgeon Assistant - Resident    Indications: [phone] with newly diagnosed glioma surgery for oncologic treatment.    Description of Operation    Setup: The patient was brought to the operating room. The patient was intubated, a-line and foley placed.    Anesthesia: General anesthesia    Pre-operative medications: ancef, keppra, vimpat, mannitol, dexamethasone    Positioning: supine bump pins    Hair Preparation: minimal shave    Neuro-navigation registration: brain lab    Sterile prep: betadine    Definitive procedure: The neuronavigation was used plan a curvilinear incision over the tumor. The surgical site was prepped and draped using sterile technique. The skin was opened sharply. Raney clips were used for hemostasis. Monopolar cautery was used to raise a temporalis muscle anteriorly. This flap was held open with fish hooks over a wet sponge. Burr holes were made at the key hole, temporal floor and posteriorly and a B1 with foot plate was used to complete the craniotomy. Peripheral tack up sutures were placed. The dura was opened in a C shaped fashion anteriorly. At this point the neuronavigation was used to confirm presence of wernicke area posterior to the lesion. Pial cuts were made at the temporal floor, anterior temporal fossa, and posterior (anterior to labbe). White matter dissection was carried out in these regions. The microscope was brought in. The syvlian fissure was opened carefully. The MCA vessels in the insula were identified. The tumor involved the entire superior temporal gyrus/temporal opercula and so this was removed en bloc. Ultrasound was used to evaluate the extent of resection. Papaverine soaked gelfoam was placed on the MCA vessels. Hemostasis was acquired with bipolar cautery and surgicel. The dura was closed with a dural patch. Helistat was placed in the epidural space and the bone flap was secured with titanium burr hole covers and central tack up sutures.The muscle and galea closed with 2-0 vicryl. The skin closed with 3-0 monocryl. A head wrap was placed.    Attestation: I was present for the critical portion of the case and provided appropriate supervision for all aspects of this case.     Added Complexity: This case involved splitting of the sylvian fissure and meticulous microdissection of tumor off the insula and MCA vessels. This took significant time and effort   Time estimate: The above factors added 120 minutes to the procedure above and beyond the normal time for this particular procedure for a total of 6 hours.    Complications: None apparent    Wound Status: Clean    Specimens:   Frozen: none   Permanent/Research: L temporal tumor      Cord Wilczynski S. Allena Katz  Assistant Professor  Department of Neurosurgery  [employer]  [place] [facility] [place]  [phone]

## 2022-11-10 NOTE — Progress Notes
11/10/22 0905   Time Calculation   Start Time 0905   Patient not seen due to Clarifying orders;Other (Comment)     PT order received. Pt currently with strict bed rest order in place. Contact MD Salina April [phone]) re: updated activity orders if pt appropriate for PT/OT today. PT will continue to follow.    ADDENDUM: At 52, pt about to work with OT. Will follow up as able.     Lacey Jensen, PT  Ph: [phone]

## 2022-11-10 NOTE — Progress Notes
Pt arrived to the floor with daughter [name] at bedside. Pt alert, vitals stable.  Bed alarm placed. Dressing appears clean dry and intact.

## 2022-11-10 NOTE — Op Note
----------------------------------------------------------------------------------------------------------------------  (THE FOLLOWING IS FOR NURSING REFERENCE AND HAS BEEN PULLED FROM THE CURRENT CHART. PACU Phone: [phone])    Procedure(s) (LRB):  LEFT CRANIOTOMY FOR TUMOR RESECTION (Left)  Brain mass [G93.89]  Treatment Team         Provider   Role Specialty Contact     Neurosurgery  Team, 1st Provider Contact Neurological Surgery, Neurological Surgery  RR ICU/CONSULT:[id number],RR FLOOR:[id number],SMH:[id number]       Ellyn Hack., MD  Attending Neurological Surgery  [phone]   [phone]       Care, Neurosurgery - Neuro Critical  Team --  [phone]       Jackson, [place] Sherae, RN  Registered Nurse --  [phone]       Gillian Scarce  Case Manager --  [phone]   Pager: [phone]       Dennard Schaumann, LCSW  Social Worker -- --     Lennart Pall  Case Manager -- --           __________________________________________________________________________________  Anesthesia Procedures  AN PERI IV ORD [place]  AN PERI IV ORD [place]  INSERT CATH,ART,PERCUT,SHORTTERM  ANESTHESIA ARTERIAL LINE    __________________________________________________________________________________    History  No past medical history on file.  No past surgical history on file.  __________________________________________________________________________________    Labs  Recent Labs     11/09/22  1456 11/09/22  6644 11/09/22  0443 11/09/22  0022 11/08/22  2231 11/08/22  0443 11/07/22  2122   WBC  --   --   --   --  25.88*  --  29.14*   HCT  --   --   --   --  35.4  --  37.1   HGB  --   --   --   --  12.0  --  12.5   PLT  --   --   --   --  254  --  292   PT  --   --   --   --  13.8  --  14.2   APTT  --   --   --   --   --   --  <24.0*   INR  --   --   --   --  1.1  --  1.1   NA  --   --  139  --  138   < > 136   K  --   --   --   --  4.3  --  3.7   CL  --   --   --   --  106  --  104   CO2  --   --   --   --  23  --  20   BUN  --   --   --   --  31*  --  30* CREAT  --   --   --   --  0.64  --  0.70   GLUCOSE  --   --   --   --  124*  --  168*   MG  --   --   --   --  1.9  --  1.7   PHOS  --   --   --   --  2.1*  --  1.6*   ICALCOR  --   --   --   --  1.27  --  1.24   GLUCOSEPOC 118*   < >  --    < >  --    < >  --     < > =  values in this interval not displayed.     __________________________________________________________________________________    Vital Signs  Last Recorded Vital Signs:    11/09/22 1745   BP: 115/61   Pulse: 72   Resp: 19   Temp:    SpO2: 93%     Temp Readings from Last 1 Encounters:   11/09/22 36.3 ?C (97.3 ?F) (Tympanic)       Pain Information (Last Filed)       Score Location Comments Edu?      3 None None None          __________________________________________________________________________________    Intake and Output  I/O last 3 completed shifts:  In: 935 [P.O.:560; I.V.:300; Other:75]  Out: -   I/O this shift:  In: 1700 [I.V.:1700]  Out: 2000 [Urine:1900; Blood:100]     __________________________________________________________________________________    IV Drips/Fluids/PCA:    hydrogen peroxide 3% ( ) with 0.9% NS ( )      non formulary 1,500 mL (11/09/22 1222)    norepinephrine      sodium chloride 50 mL/hr (11/09/22 0017)    vancomycin (Vancocin) 500 mg in sodium chloride 0.9% 500 mL 1 mg/mL irrigation soln 500 mg (11/09/22 1058)     __________________________________________________________________________________    Lines, Drains, and Airways  Urethral Catheter Latex;Standard 16 Fr. (Active)       Peripheral IV 22 G Anterior;Right Forearm (Active)       Peripheral IV 16 G Left Antecubital (Active)       Peripheral IV 18 G Right Antecubital (Active)       Wound 11/04/22 Ecchymosis Left;Posterior Forearm secondary to IV start from previous hosp (Active)         Incision 11/09/22 Left Head (Active) __________________________________________________________________________________    ----------------------------------------------------------------------------------------------------------------------      PACU NursingTransfer Note  5:58 PM, 11/09/2022      Isolation? No    Antibiotics in OR:  Last Antibiotic (last 24 hours) Showing orders from other encounters      Date/Time Action Medication Dose Rate    11/09/22 1349 Given    ceFAZolin inj 2 g     11/09/22 1058 New Bag/ Syringe/ Cartridge    vancomycin (Vancocin) 500 mg in sodium chloride 0.9% 500 mL 1 mg/mL irrigation soln 500 mg 500 mL/hr    11/09/22 0946 Given    ceFAZolin inj 2 g           Last Antiemetic:   Med Administrations and Associated Flowsheet Values (last 4 hours)       Date/Time Action Medication Dose    11/09/22 1544 Given    [MAR Hold] ondansetron 4 mg/2 mL inj 4 mg (MAR Hold since Wed 11/09/2022 at 0916.Hold Reason: Unreviewed Transfer Orders) 4 mg          Acetaminophen given @:  Med Administrations and Associated Flowsheet Values (last 24 hours) Showing orders from other encounters      Date/Time Action Medication Dose    11/09/22 1455 Given    acetaminophen IV inj 1,000 mg          Last pain medication:   Pain Meds (last 4 hours) Showing orders from other encounters      Date/Time Action Medication Dose    11/09/22 1615 Given    HYDROmorphone 2 mg/mL inj 0.4 mg    11/09/22 1455 Given    acetaminophen IV inj 1,000 mg          Last Txp Anti-rejection medication:  Anti-rejection meds. (last 24 hours) Showing  orders from other encounters      Date/Time Action Medication Dose    11/09/22 0957 Given    dexAMETHasone PF 10 mg/mL inj 10 mg            Does the patient use prescription pain medication at home (prior to admission)?Marland KitchenMarland KitchenMarland KitchenUnknown    Pain level: Acceptable for patient? Yes    Difficult Airway? No    Respiratory is at baseline? Yes    Has the patient received Flumazenil or Narcan in PACU? No  (if ''Yes'', must be at least 45 minutes prior to transfer)     Aldrete: 9    Level of Consciousness:   oriented to self, place. Difficulty expressing basic needs    Cardiac Rhythm?  Normal Sinus    Surgical Site: Intact and Dry or with Minimal Drainage  Lines, Drains, and Airways       Wound  Duration             Wound 11/04/22 Ecchymosis Left;Posterior Forearm secondary to IV start from previous hosp 5 days      Incision 11/09/22 Left Head <1 day                   Diet: Regular    Tolerating liquids: Undetermined    Activity: MAE: Has not ambulated    Voided:  Foley    Significant Other Location:  At Bedside    Will Transport to: Floor                       With: RN & Monitor    Continuity of Care Issues from OR or PACU:   Mri within 24hours

## 2022-11-10 NOTE — Consults
IP CM ACTIVE DISCHARGE PLANNING  Department of Care Coordination      Admit [id number]  Anticipated Date of Discharge: 11/11/2022    Following [id number], Marquis Buggy., MD      Today's short update     Per chart review and IDR today, POD1 s/p L temporal crani for tumor; MRI pending, pending PT/OT/SLP eval    Update:     OT recs: ARU      Disposition     Acute Rehab Unit, Pending plan  TBD  Family/Support System in agreement with the current discharge plan: Yes, in agreement and participating    Facility Transfer/Placement Status (if applicable)     Pending post-acute dispo recs (1/7), Referral sent-out to providers (via [facility]) (2/7)    Medical Transport Arrangement Status (if applicable)     Transportation needed (1/6)    Other Arrangements (if applicable)     Comments: Assigned CM will continue to re-assess safe discharge plan based on primary medical and interdisciplinary team recommendation(s).     Gillian Scarce RN, BSN  Covering Clinical Case Manager   Neurosurgery ~ [facility]  Tel: [phone]   ~   Pager: [phone]    Department of Care Coordination Main Office:   Phone: [phone]   ~ Fax: [phone]    For Assistance After Hours:   Weekdays Swing Shift : Pager [phone]  ED CM: [phone] ~ Pager [phone]   Weekends: [phone] ~ Pager [phone]

## 2022-11-10 NOTE — Consults
Occupational Therapy Evaluation      PATIENT: [name]  MRN: [id number]  DOB: 12/19/1945    ADMIT DATE: 11/04/2022       Date of Evaluation: 11/10/2022    Problems: Principal Problem:    Brain mass (POA: Yes)       Past Medical History:   Diagnosis Date    Arthritis     Hypertension     Past Surgical History:   Procedure Laterality Date    FRACTURE SURGERY      JOINT REPLACEMENT          Relevant Hospital Course: 77 y.o. F presenting to an OSH w/ word finding difficulty, mild RUE weakness, & lethargy. 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 Transferred to [facility] for HLOC on 11/04/22. Now s/p L frontal craniotomy for resection of brain tumor on 11/09/22. Of note: strict bed rest DC'd on 11/10/22 @ 0936.    Patient Stated Goal:  Pointed to the restroom     Living Arrangements   Type of Home: House  Home Layout: One level, Other (Comment) (1 step to enter)  Bathroom Shower/Tub: Tub/shower unit, Walk-in shower (but uses the tub/shower @ baseline)  Bathroom Equipment: Grab bars in shower, Hand-held shower  Home Equipment: Cane  Additional Comments: R handed. Patient speaks [place], [place], & [place]. Patient offered a [place] speaking interpreter during OT eval but gestured no w/ hands & pointed to daughter, [name], to translate.    Prior Level of Function   Level of Independence: Independent, Community ambulation  Lives With: Spouse  Support Available: Family  # of hours available: 24hr/day supervision & limited physical assistance from husband. Daughter stating that pt's husband is unable to drive & that his English is limited.  ADL Assistance: Independent, Activities of Daily Living  Homemaking Assistance: Independent  Vision: Glasses  Hearing: Within Functional Limits  Transportation: Drives Self    Precautions   Precautions: Fall risk;Craniotomy;Monitor Vitals;Check Labs  Orthotic: None  Current Activity Order: Order implies OOB    GENERAL EVALUATION   Position: In bed;SCD's;Family/CG present  Lines/devices Drains: IV/PICC;Pulse Ox;Cardiac Monitor    Bed Mobility   Supine Scooting: Maximum Assist  Rolling: Contact Guard Assist;Verbal Cueing  Supine to Sit: Contact Guard Assist;Verbal Cueing (req VC for safety 2/2 impulsive @ times)  Sit to Supine: Minimum Assist;Verbal Cueing;Assist for LE(s) (req VC for safety 2/2 impulsive @ times)    Functional Transfers   Sit to Stand: Contact Guard Assist;Verbal Cueing (from EOB; req VC for safety 2/2 impulsive @ times)  Transfer: Futures trader Transfers: Minimum Assist;Verbal Cueing;Grab Bar (req VC for safety 2/2 impulsive @ times, req repeated VC to use grab bars for safety)  Functional Mobility: Minimum Assist;Verbal Cueing (w/ IV pole)    Activities of Daily Living (ADLs)   UB Dressing: Performed (gown as a coat)  UB Dressing Assistance: Maximum Assist  UB Dressing Deficit: Supervision/safety;Increased time to complete;Thread RUE;Thread LUE;Pull around back  UB Dressing Adaptive Equipment: None  UB Dressing Where Assessed: Edge of bed  LB Dressing: Performed (socks)  LB Dressing Assistance: Dependent (therapist performed task 2/2 pt w/ decreased ability to currently maintain crani precautions)  LB Dressing Deficit: Don/doff R sock;Don/doff L sock  LB Dressing Adaptive Equipment: None  Toileting: Performed  Toileting Assistance: Contact Guard Assist (req moderate VC for safety 2/2 impulsive during task)  Toileting Deficit: Verbal cueing  Toileting Adaptive Equipment: None  Toileting Where Assessed: Toilet  Balance   Sitting - Static: Good  Sitting - Dynamic: Fair     RUE Assessment   RUE Assessment: Exceptions to [facility]        RUE Overall Strength: Within Functional Limits-able to perform ADL tasks with strength per task observation; formal MMT not performed 2/2 decreased cognition. However, would benefit from further assessment in a future tx session    LUE Assessment   LUE Assessment: Exceptions to [facility]         LUE Overall Strength: Within Functional Limits-able to perform ADL tasks with strength per task observation; formal MMT not performed 2/2 decreased cognition. However, would benefit from further assessment in a future tx session    Hand Function   Gross Grasp: Functional;Right;Left  Coordination: Functional;Right;Left    Edema   Edema: not noted in BUE    Sensation   Sensation: Unable to assess    Cognition   Cognition: Exceptions to WDL or Baseline Status  Arousal/Alertness:  (alert)  Attention Span: Attends with cues to redirect (but req min-max VCs depending on task)  Following Commands: Follows 2-3 step commands with repetition;Follows 2-3 step commands with increased time->able to physically carry out task being asked. However, observed to have difficulty providing verbal response to questions  Initiation: Fair  Safety Awareness: Impulsive  Barriers to Learning: Cognitive Limitations    Patient able to integrate craniotomy precautions w/ 50% consistency today during OT eval.    Vision   Current Vision: Wears glasses all the time  Patient Visual Report: Other (Comment) (denies blurry or double vision when asked)  Complex Visual Assessment: Performed  Tracking: Requires cues, head turns, or add eye shifts to track (to track in all quadrants)    Able to correctly state the correct number of fingers being held up by this writer but showing the number on her hands. Was initially unable to complete task when a verbal response was asked of her.      Neurological Evaluation (if indicated)   Neuro Deficits: Yes  Inattention/Neglect: Appears Intact  Midline Orientation:  (noted to have forward lean @ trunk when upright)  Motor Planning: Cues to initiate activity  Motor Control:  (TBA further in future tx session)  Coordination:  (TBA further in future tx session)    Pain Assessment   Patient complains of pain: No    Patient Status   Activity Tolerance: Good  Oxygen Needs: Room Air  Response to Treatment: Other (Comment) (BP was 105/47 supine pre-OT eval, c/o feeling lightheaded while on toilet->returned BTB, BP was 116/54 supine post-OT eval)  Compliance with Precautions: Fair  Call light in reach: Yes  Presentation post treatment: In bed;On cardiac monitor;Lines/drains intact;SCD's;Pulse Ox;Heels floated;w/RN;Family/CG present;Other (Comment) (w/ BUE elevated on pillows)      Interdisciplinary Communication   Interdisciplinary Communication: Nurse;Case Manager;Physical Therapist (messaged CM Hal Neer re: DC recs)    ASSESSMENT   Rehab Potential: Good  Inpatient Recommendation: OT treatment  Problem List: Decreased functional transfers;Decreased self care skills;Decreased visual perception;Decreased cognition;Decreased safety awareness;Decreased ability to integrate precautions;Limited caregiver knowledge;Decreased midline orientation;Discharge needs;Impaired attention span;Decreased functional balance  Treatment Plan: ADL training;Fine motor training;Gross motor training;Patient and/or family education;Sensation retraining/compensation;Therapeutic exercise;Range of motion/self ranging;Caregiver training;Splinting/positioning;Orthotic adjustment as needed;Discharge planning;Training on use of assistive devices;Neurofacilitation/muscle re-education;Edema reduction techniques;Energy conservation;Graded functional activities;Perceptual-motor training;Functional cognitive tasks;Home program;Coordinate with nurse to pre-medicate patient as needed;Functional transfer training;Sensory integration activities;Functional balance activities;Dysphagia/oral motor activity;Equipment evaluation training;Developmental activities;Modalities as indicated  Frequency: 3-5 x/week  Duration (days): 30  Progress Note Due  Date: 11/17/22      Goals Discussed With: Family    Short Term Goals to be achieved in: 7 days  Pt will dress upper body: sitting edge of bed;with minimum assist;with verbal cues  Pt will perform: stand step transfer;to/from toilet;with contact guard assist;with verbal cues (& assistive device)  Patient will be able to integrate craniotomy precautions while performing ADL's w/ 70% consistentcy.    Long Term Goals to be achieved in: 30 days  Pt will be: assisted performing self care activities with verbal cues for safety and appropriate assistance and equipment, as needed    OT Recommendations   Discharge Recommendation: Occupational Therapy;Would benefit from intensive therapy upon discharge  Discharge concerns: Requires assistance for mobility;Requires assistance for self care;Impaired cognition;Poor safety awareness  Discharge Equipment Recommended: Defer to discharge facility    Comments: Patient & daughter educated on OT role, pupose of OT eval, craniotomy precautions (handouts given in English & Spanish-of note: pt is trilingual), & need for further OT intervention upon DC.    AM-PAC   AM-PAC Daily Activity t-Scale Score: 32.03  AM-PAC Daily Activity CMS 'G Code' Modifier: CL             Evaluation Completed by: Alen Blew, OT,  11/10/2022

## 2022-11-10 NOTE — Consults
Speech Language Pathology  Communication/Cognition Assessment    PATIENT: [name]  MRN: [id number]  DOB: 1945/11/06    ADMIT DATE: 11/04/2022       Date of Evaluation: 11/10/2022  Evaluation Start Time: 1315    Problems: Principal Problem:    Brain mass (POA: Yes)       Past Medical History:   Diagnosis Date    Arthritis     Hypertension     Past Surgical History:   Procedure Laterality Date    FRACTURE SURGERY      JOINT REPLACEMENT            Assessment     Treatment Diagnosis: Expressive > Receptive Aphasia    Prior Level of Function: Information obtained from patient's daughter [name]). Prior to hospitalization, pt was living at home with her husband. Wears glasses (all the time). Hearing intact. R-handed. Highest level of education: Grade 8. Languages: [place], Portugese, [place].    Background Relevant to Treatment Diagnosis: 77 y.o. F who presented to OSH w/ word finding difficulty, mild RUE weakness, & lethargy. 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 Transferred to [facility] for HLOC on 11/04/22. Now s/p L frontal craniotomy for resection of brain tumor on 11/09/22.     Patient Presentation: Alert;Responsive and cooperative; Daughter occasionally reported that patient was growing frustrated with language loss. Also, per report of daughter, patient's current communication skills may be more successful in L1: Portugese as opposed to [place] (based off their interactions).     Financial risk analyst Used?: No; Patient's daughter occasionally translated to Portugese during the evaluation.    Pain Assessment     Patient complains of pain: Not Assessed    Oral Peripheral Exam (if applicable)     Findings: Abnormal  Deficits: Facial droop on right (subtle);Tongue deviates right on protrusion (minimally); Upper partial in place    Speech (if applicable)     Findings: Abnormal  Deficits: Limited decipherable content (mostly mumbling like utterances), occasionally with spontaneous fluent phrases (ie; ''yes'' or ''I feel'')    Language (if applicable)     Auditory Comprehension: Tested  Yes/No Questions: Tested (visual cue card with ''yes'' and ''no'' in writing presented to encourage success)  Biographic Yes/No Questions: Tested, 3/4  Simple Non-Biographic Questions: Tested, 1/3  Complex Questions: Tested, Responses were not clear (despite encouragement to use cue card)  Spoken Commands: Correct responses for 1-part spoken commands for oral motor exam, but incorrect for pointing to items or handing SLP items (ie: ''Point to the coin'' or ''Give me the pen'')  Short Paragraph Comprehension: Not Tested  Responses in Conversation: Tested; When asked, ''How are you today?'' Replied, ''mm''; When asked, ''What is your full address?'', pt approximated town, [address] but was unable to state street address (despite verbal cues for    Reading Comprehension: Not tested    Spoken Expression: Tested  Conversation: Obvious word finding problems;Literal paraphasias noted;Reduced speech fluency;Reduced content, Perseveration  Confrontation Naming: Anomia;Literal paraphasias noted; No successful verbal output (predominantly generated single syllable responses that did not approximate name); Pt occasionally gestured function of item presented correctly (ie: gestured opening a lock when shown a key)  Generative Naming: Not Tested  Automatic Speech: Tested, Pt not successful with counting or DOW (of note, when shifted to DOW, pt counted a few numbers in Spanish)  Repetition: Tested, Pt not successful with single word repetition     Written Expression: Tested  Write Name: No errors  Written Discourse on a Given Topic:  Given prompt ''I am at the _____'', pt filled in ''home'' and ''house'' then in a difference section wrote, heat, home and another unintelligible word (sidugs)  Given prompt ''My address is: ______'', pt filled in ''homes''  Poor awareness of errors;Perseveration    Gestural Expression (taken from the WAB)  Pt waved goodbye (with SLP model), independently closed eyes (to verbal instruction)   Pt did not perform the following commands despite provision of SLP model: Blow out match, knock at door, use a toothbrush    Cognition (if applicable)     Patient oriented to: self (verbally and in writing); situation (pointed to head / crani cap); but disoriented to place (wrote ''home'', however error possibly correlated to patient's aphasia)     Impressions     Pt's Current Level of Communication Function: The patient is oriented to self, demonstrated understanding of rationale for hospitalization, and she can follow some simple directives.     Abnormal Findings/Test Results: Speech and language skills were informally assessed. Functional communication skills are severely limited with very few intelligible words/phrases generated throughout this session. Expressively speaking, naming, repetition, and automatic speech were impaired. Verbal content was markedly reduced, with apparent anomia and perseverative tendencies. Patient able to state and write her own name and answer some simple Qs via gesture. Receptively, pt could answer some simple Y/N questions and follow some simple directives, but often needed redirection to task and benefited from visual prompt (''yes'' and ''no'' in writing). For example, when asked to point to the ceiling or the window, pt depended on SLP to mimic this gesture. The patient appeared somewhat frustrated by her deficits at times, however, she also, on other instances appeared to lack awareness/insight into presenting deficits.      Estimated Impact of Pt's Communication/Cognitive Deficits: Patient demonstrated severe speech and language deficits with an obvious loss of speech fluency which impacts her ability to communicate even basic wants/needs. She will have difficulty understanding spoken and written language. The patient is frustrated by her ability to communicate effectively.The range of information that can be exchanged is limited and the listener carries the burden of communication.    Treatment Plan and Goals     Patient Goals: Patient wants to be able to communicate normally.  Treatment Recommended During This Hospital Stay: Yes  Rehabilitation Potential: Fair  Treatment Plan: Language retraining  Frequency of Treatment: 1-3 days a week, 15-60 minutes per session  Progress Note Due Date: 11/17/22  Long-Term Goals: Improve expressive and receptive language skills needed for participating in iADLs  (LTG to be completed by the end of therapy or by discharge from acute hospital stay)    Auditory Comprehension Short Term Goals  Pt will comprehend single sentences: when answering personally related yes/no questions  Accuracy: 80%  Goal Status: Initial    Spoken Expressive Language Short Term Goals  Pt will verbally complete common phrases: for questions related to personal information  Accuracy: 80%  Goal Status: Initial    Recommendations     Communication techniques that are helpful to this pt include:   -Minimize distractions   -Maintain quiet/calm environment, avoid competing auditory stimuli  -Simplifying verbal instructions  -Providing forced choice or yes/no options  -Providing repetition/clarification as needed    Education     Patient Education: Yes ? Topic: Evaluation results and Evaluation recommendations  ? Person Taught: child      ? Barrier: None   ? Needs: Needs review and  New material    ? Teaching Method: Verbal instruction  ? Outcome: Able to repeat information and/or demonstrate what was taught    Evaluation Completed by: Heywood Iles, SLP,  11/10/2022  [phone]  [id number]

## 2022-11-11 ENCOUNTER — Ambulatory Visit: Payer: PRIVATE HEALTH INSURANCE

## 2022-11-11 ENCOUNTER — Ambulatory Visit: Payer: Commercial Managed Care - Pharmacy Benefit Manager

## 2022-11-11 LAB — Phosphorus: PHOSPHORUS: 1.5 mg/dL — ABNORMAL LOW (ref 2.3–4.4)

## 2022-11-11 LAB — Basic Metabolic Panel
ANION GAP: 11 mmol/L (ref 8–19)
CALCIUM: 8.7 mg/dL (ref 8.6–10.4)
SODIUM: 140 mmol/L (ref 135–146)
TOTAL CO2: 23 mmol/L (ref 20–30)

## 2022-11-11 LAB — Sodium
SODIUM: 138 mmol/L (ref 135–146)
SODIUM: 140 mmol/L (ref 135–146)

## 2022-11-11 LAB — Prothrombin Time Panel: PROTHROMBIN TIME: 15.1 s — ABNORMAL HIGH (ref 11.5–14.4)

## 2022-11-11 LAB — Glucose,POC
GLUCOSE,POC: 122 mg/dL — ABNORMAL HIGH (ref 65–99)
GLUCOSE,POC: 140 mg/dL — ABNORMAL HIGH (ref 65–99)
GLUCOSE,POC: 128 mg/dL — ABNORMAL HIGH (ref 65–99)
GLUCOSE,POC: 110 mg/dL — ABNORMAL HIGH (ref 65–99)

## 2022-11-11 LAB — Tissue Exam

## 2022-11-11 LAB — Magnesium: MAGNESIUM: 1.8 meq/L (ref 1.4–1.9)

## 2022-11-11 LAB — CBC
MEAN CORPUSCULAR HEMOGLOBIN: 31.4 pg (ref 26.4–33.4)
RED CELL DISTRIBUTION WIDTH-CV: 12.9 (ref 11.1–15.5)

## 2022-11-11 LAB — Calcium,Ionized: IONIZED CA++,UNCORRECTED: 1.12 mmol/L (ref 1.09–1.29)

## 2022-11-11 MED ADMIN — DEXAMETHASONE IVPB: 6 mg | INTRAVENOUS | Stop: 2022-11-12 | NDC 67457042300

## 2022-11-11 MED ADMIN — SODIUM CHLORIDE 0.9 % IV SOLN: 75 mL/h | INTRAVENOUS | @ 13:00:00 | Stop: 2022-11-12 | NDC 00338004904

## 2022-11-11 MED ADMIN — DEXAMETHASONE IVPB: 6 mg | INTRAVENOUS | @ 18:00:00 | Stop: 2022-11-12 | NDC 67457042254

## 2022-11-11 MED ADMIN — LEVETIRACETAM 500 MG PO TABS: 1000 mg | ORAL | @ 16:00:00 | Stop: 2023-01-10 | NDC 51079082101

## 2022-11-11 MED ADMIN — PANTOPRAZOLE SODIUM 40 MG PO TBEC: 40 mg | ORAL | @ 16:00:00 | Stop: 2022-11-19 | NDC 50268063911

## 2022-11-11 MED ADMIN — PSYLLIUM PO PACK (MULTI-GPI): 1 | ORAL | @ 15:00:00 | Stop: 2022-12-10

## 2022-11-11 MED ADMIN — HEPARIN SODIUM (PORCINE) 5000 UNIT/ML IJ SOLN: 5000 [IU] | SUBCUTANEOUS | @ 16:00:00 | Stop: 2022-12-11 | NDC 00409272330

## 2022-11-11 MED ADMIN — LEVETIRACETAM 500 MG PO TABS: 1000 mg | ORAL | @ 03:00:00 | Stop: 2022-11-11 | NDC 51079082101

## 2022-11-11 MED ADMIN — PSYLLIUM PO PACK (MULTI-GPI): 1 | ORAL | @ 03:00:00 | Stop: 2022-12-10

## 2022-11-11 MED ADMIN — DOCUSATE SODIUM 100 MG PO CAPS: 100 mg | ORAL | @ 16:00:00 | Stop: 2022-12-10 | NDC 00904718361

## 2022-11-11 MED ADMIN — CALCIUM CARBONATE ANTACID 500 (200 CA) MG PO CHEW: 1000 mg | ORAL | @ 16:00:00 | Stop: 2022-11-19 | NDC 68084098833

## 2022-11-11 MED ADMIN — LACOSAMIDE 100 MG PO TABS: 100 mg | ORAL | @ 17:00:00 | Stop: 2022-11-17 | NDC 60687068711

## 2022-11-11 MED ADMIN — DEXAMETHASONE IVPB: 6 mg | INTRAVENOUS | @ 07:00:00 | Stop: 2022-11-12 | NDC 67457042254

## 2022-11-11 MED ADMIN — LACOSAMIDE 100 MG PO TABS: 100 mg | ORAL | @ 03:00:00 | Stop: 2022-11-24 | NDC 60687068711

## 2022-11-11 MED ADMIN — ACETAMINOPHEN 325 MG PO TABS: 650 mg | ORAL | @ 19:00:00 | Stop: 2022-11-18

## 2022-11-11 MED ADMIN — ACETAMINOPHEN 325 MG PO TABS: 650 mg | ORAL | @ 15:00:00 | Stop: 2022-11-18

## 2022-11-11 MED ADMIN — GADOBUTROL 1 MMOL/ML IV SOLN: 7.5 mL | INTRAVENOUS | @ 11:00:00 | Stop: 2022-11-11 | NDC 65219028107

## 2022-11-11 MED ADMIN — OXYCODONE HCL 5 MG PO TABS: 10 mg | ORAL | @ 07:00:00 | Stop: 2022-11-17 | NDC 68084035411

## 2022-11-11 MED ADMIN — DOCUSATE SODIUM 100 MG PO CAPS: 100 mg | ORAL | @ 03:00:00 | Stop: 2022-11-19 | NDC 00904718361

## 2022-11-11 MED ADMIN — CALCIUM CARBONATE ANTACID 500 (200 CA) MG PO CHEW: 1000 mg | ORAL | @ 03:00:00 | Stop: 2022-11-19 | NDC 68084098833

## 2022-11-11 MED ADMIN — HEPARIN SODIUM (PORCINE) 5000 UNIT/ML IJ SOLN: 5000 [IU] | SUBCUTANEOUS | @ 03:00:00 | Stop: 2022-11-15 | NDC 00409272330

## 2022-11-11 NOTE — Other
Patient's Clinical Goal:   Clinical Goal(s) for the Shift: VSS, safety, comfort  Identify possible barriers to advancing the care plan:   Stability of the patient: Moderately Unstable - medium risk of patient condition declining or worsening    Progression of Patient's Clinical Goal: A/ox3, expressive aphasia. Follow commands. Uses FWW to ambulated to RR. No complaints of pain. Worked with OT today.   Plan: ARU.

## 2022-11-11 NOTE — Progress Notes
Occupational Therapy Treatment    PATIENT: [name]  MRN: [id number]    Treatment Date: 11/11/2022    Patient Presentation: Position: In bed;Bed alarm on  Lines/devices Drains: HLIV;Pulse Ox    Pertinent Updates: 4/03 1.  Status post left sided craniotomy and gross total resection of left temporal lobe mass.   2.  Postsurgical epidural collection subjacent to the craniotomy flap exerts mild mass effect on the left cerebral hemisphere. There is approximately 4 mm rightward shift of the septum pellucidum.  3.  Trace left parieto-occipital subdural hemorrhage as well as scattered small volume subarachnoid hemorrhage.    Precautions   Precautions: Fall risk;Craniotomy;Monitor Vitals;Check Labs  Orthotic: None  Current Activity Order: Order implies OOB  Weight Bearing Status: Not Applicable    Cognition   Cognition: Exceptions to WDL or Baseline Status  Arousal/Alertness: Appropriate responses to stimuli  Attention Span: Attends with cues to redirect  Memory: Decreased recall of precautions  Following Commands: Follows 2-3 step commands with repetition;Follows 2-3 step commands with increased time  Problem Solving: Assistance required to identify errors made  Sequencing: Intact  Initiation: Fair  Safety Awareness: Impulsive  Barriers to Learning: Cognitive Limitations    Bed Mobility   Supine to Sit: Contact Guard Assist;Verbal Cueing  Sit to Supine: Minimum Assist;Verbal Cueing;Assist for LE(s)    Functional Transfers   Sit to Stand: Contact Guard Assist;Verbal Cueing;Assistive Device (Comment) (from EOB x2 trials w/ FWW, x1 trial from toilet)  Transfer: From;Bed;To;Toilet (Bed>toilet>bed>hallway>bed)  Level of Assist: Minimum Assist  Type of Transfer: Stand step;Front wheeled Barista Transfers: Minimum Assist;Verbal Administrator, Civil Service Mobility: Minimum Assist;Verbal Cueing (min A for steadying)    Activities of Daily Living (ADLs)   Grooming: Performed (hand hygiene)  Grooming Assistance: Orthoptist Deficit: Verbal cueing;Supervision/safety  Grooming Adaptive Equipment: None  Grooming Where Assessed: Standing sinkside  UB Dressing: Performed (donning robe)  UB Dressing Assistance: Contact Guard Assist  UB Dressing Deficit: Verbal cueing;Supervision/safety  UB Dressing Adaptive Equipment: None  UB Dressing Where Assessed: Standing  Toileting: Performed (void)  Toileting Assistance: Print production planner Deficit: Verbal cueing  Toileting Adaptive Equipment: None  Toileting Where Assessed: Toilet    Balance   Sitting - Static: Good  Sitting - Dynamic: Fair     Neurological (if indicated)   Neuro Deficits: Yes  Inattention/Neglect: Appears Intact  Motor Planning: Cues to initiate activity  Motor Control: Grossly intact all extremities  Coordination: Decreased accuracy  Perseveration: Not present  RUE Tone: Within Functional Limits  LUE Tone: Within Functional Limits  Head Control and Alignment: Within Functional Limits  Trunk Control: Within Functional Limits    Exercises   Scapular Retraction: Seated;Bilateral;10 Reps  Shoulder Shrugs: Seated;Bilateral;10 Reps  Shoulder Flexion: Seated;Active;Bilateral;10 Reps    Interventions   Balance Training: Standing weight shifting all planes  Fine Motor Training: Grooming tasks    Pain Assessment   Patient complains of pain: No    Patient Status   Activity Tolerance: Good  Oxygen Needs: Room Air  Response to Treatment: Tolerated treatment well  Compliance with Precautions: Fair  Call light in reach: Yes  Presentation post treatment: In bed;On cardiac monitor;Lines/drains intact;Pulse Ox;Heels floated;Family/CG present  Comments: Pt impulsive when performing functional mobility requiring min A for steadying. Re-educated and reinforced craniotomy precautions w/ pt and pts daughter; pts daughter verbalized understanding, however pt w/ poor memory recall.    Interdisciplinary Communication   Interdisciplinary Communication: Nurse;Occupational Therapist;Certified Occupational  Therapy Assistant    Treatment Plan   Continue OT Treatment Plan with Focus on: ADL training;Functional mobility training;Patient/family/caregiver education and training    OT Recommendations   Discharge Recommendation: Occupational Therapy;Would benefit from intensive therapy upon discharge  Discharge concerns: Requires assistance for mobility;Requires assistance for self care;Impaired cognition;Poor safety awareness  Discharge Equipment Recommended: Defer to discharge facility    Treatment Completed by: Karyl Kinnier OTA-S

## 2022-11-11 NOTE — Nursing Note
0140 [name], MRI called regarding pt to come down in about 23 minutes. Awaiting for MRI to pick up patient. Pt is aware to be picked up for MRI.

## 2022-11-11 NOTE — Progress Notes
IP CM ACTIVE DISCHARGE PLANNING  Department of Care Coordination      Admit [id number]  Anticipated Date of Discharge: 11/12/2022    Following BJ:YNWGN, Marquis Buggy., MD      Today's short update     PT/OT recs ARU. patient and dtr, [name], are agreeable. Dtr lives in [place] and would be prefer a facility in the [place] area. Patient has Tamala Ser with [facility]    Disposition     Acute Rehab Unit  TBD  Family/Support System in agreement with the current discharge plan: Yes, in agreement and participating      Facility Transfer/Placement Status (if applicable)     Pending post-acute dispo recs (1/7), Referral sent-out to providers (via Lois Huxley) (2/7)    [facility] (IP[facility])  CM Selena Batten (covering for [place] ext [phone])  P: [phone]  Coordinator Elva  P: [phone]    Per Pernell Dupre, only contracted ARUs are [facility] and [facility]. Referral sent via aidin:    [facility]  Phone: [phone]    [facility]  Phone: [phone]    Medical Transport Arrangement Status (if applicable)     Transportation needed (1/6)         Lennart Pall,  11/11/2022

## 2022-11-11 NOTE — Progress Notes
NEUROSURGERY PROGRESS NOTE     DATE OF SERVICE:  11/11/2022    PATIENT: [name]        DOB: 1945/12/03    ATTENDING PHYSICIAN:  Ellyn Hack., MD    ADMISSION DATE:  11/04/2022    LENGTH OF STAY:  LOS: 7 days     CHIEF COMPLAINT: brain tumor     ACTIVE MEDICAL PROBLEMS:   Patient Active Problem List   Diagnosis    Brain mass       Present on Admission:   Brain mass      HPI:   [name] is a 77 y.o. year old female not on AC/AP who presents with 2 weeks of progressive speech difficulty that she describes as 'the words coming out wrong' as well as lethargy. Language problems are equally present when speaking in [place] (first language), [place] and [place]. Denies any memory problems other than what she considers normal aging. Pt reports that she is otherwise in her usual state of health and denies any seizures, confusion, receptive language difficulty, sensory deficits, or gross motor deficits. Otherwise, she is very active and functional at baseline. 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 She has no known hx of systemic canc       INTERVAL EVENTS:   3/30 Consulted IMCS for pre operative clearance. Pending OR for 11/09/22 with Dr. Allena Katz .   3/31: Na 132 (134). Discont 75cc NaCl. 0.2mg  bid Florinef. 1g salt tab bid today. Neurointact at bedside including language. Sitting bedside, eating, walking. Plan for OR 4/3. Cleared IMCS.  4/1: AFVSS. NAEON. Na 134 (135). Florinef, salt tabs, 3% 12.5cc/h. Dex/Keppra. OR Weds 4/3.  4/2: AFVSS. NAEON. Na 136 (135). Florinef, salt tabs, 3% 12.5cc/h. Dex/Keppra. OR Weds 4/3. Functional MRI scheduled for 6 am.  3%NaCL stopped  NPO P MN/IVF    4/4 POD1 s/p L temporal crani for tumor; MRI pending. NAEON, AF VSS. WBC 34.87 (37.52), Hb 10.5 (11). Na 139. Foley. PT/OT.  4/5 POD2. AF VSS. MRI complete pending read. WBC 29. Hb 9.8. Na 140. Discont foley yest. Dispo ARU per PT/OT. SLP cog eval     OBJECTIVE:     Vital Signs:   Temp:  [36 ?C (96.8 ?F)-36.8 ?C (98.2 ?F)] 36.4 ?C (97.5 ?F)  Heart Rate:  [53-81] 56  Resp:  [15-20] 19  BP: (110-148)/(54-68) 133/58  NBP Mean:  [72-85] 79  SpO2:  [96 %-97 %] 96 %  On RA    GENERAL: Comfortable, breathing comfortably, resting in bed in no apparent distress    NEURO EXAM:  EOS  AO1 y/n  PERRL  English: Naming 0/3 (watch for clock), no repetition, word salad  Fcx4  Full strength    Chest: symmetrical chest rise, CTA in all lobes, NSR, (-)murmur  Vascular: 2+ pulses throughout (-)edema, cyanosis  ABD: soft, nontender, flat, normoactive bowel sounds LBM 4/1  Voiding    LINES AND DRAINS  CENTRAL LINE: None  DRAINS: None  FOLEY: None  SUTURE/STAPLE: 2 weeks post-op (4/17)    DATA   Pre-operative surgical visit and medical clearance visit documentation obtained and reviewed. Clinical lab tests reviewed daily. All associated imaging for this patient was independently reviewed and interpreted as listed below:    Post-op imaging:     11/11/22 MRI brain w/wo  Pending read    11/04/22 CT brain outside imaging   1. Large mixed density mass centered in the left anterior and mid temporal  lobe with moderate perilesional vasogenic edema, regional mass effect and midline shift from left to right at the level of septum pellucidum of 6 mm. 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  2. There is no evidence of subfalcine or uncal herniation.  3. No hydrocephalus.      ASSESSMENT AND PLAN:   28F admitted to inpatient pending OR, craniotomy for tumor resection.    NEURO  # Brain tumor  # Subacute aphasia (POA )  -S/p L temporal crani, tumor rxn  -Hyponatremia (goal 135-145)  discont NaCl 75cc/h  cont 0.2 Flurinef bid  salt tabs 1g bid 3/31 am  3% NaCl 12.5cc/h 4/1 > stopped 4/2  -Sutures: POD14, 4/18  - Post-op MRI brain w/wo    Vasogenic edema (POA)   AED: Keppra 1000mg  po bid x7d, vimpat 100 mg bid for 2 weeks (4/17)  Steroid: Dexamethasone 6mg  Q8 PO to 4 mg  Florinef 0.2mg  po bid x7 days   Suture/staple removal on POD#14   3/30 IMCS consulted for Preop clearance. Appreciate recs    Cleared for OR per IMCS    PAIN  No active issues  Tylenol and oxycodone PRN    RESPIRATORY  No active issues  Incentive spirometry    CARDIOVASCULAR  Hypertension  Bradycardia   Goal SBP 90-160  Keep K >4 and Mg >2  DC propranolol per IMCS    GU / RENAL  No active issues  Continue to monitor UOP    ID  Leukocytosis, afebrile 2/2 dexamethasone  Antibiotics: None   Continue to monitor WBC     HEME / ONC  No active issues  Keep Hgb >7  Continue to monitor H&H    ENDOCRINE  Hyperglycemia  Glucose goal 100-140   Glycemic control with SSI    GI / HEPATIC  No active issues  Diet: Regular > NPO P MN with IVF 50cc/hr   Nausea management: zofran PRN  Bowel regimen  Ulcer prophylaxis    PPx  DVT prophylaxis: SQH started today,   SCDs    Disposition  PT/OT: ARU  SLP cog: auditory/expressive language 80% accuracy    The patient was examined and discussed with the resident Neurosurgery team & Attending Ellyn Hack., MD who agrees with assessment and plan listed above.    Author:    Pryor Ochoa, MD   5:38 AM 11/11/2022  Department of Neurological Surgery

## 2022-11-11 NOTE — Consults
SPIRITUAL CARE CONSULTATION NOTE    PATIENT:  [name]  MRN:  [id number]     Patient Info        Religious/Spiritual Identity:        Catholic       Last Anointed Date:                 Baptised:                 Spiritual Care Visit Details              Date of Visit:  11/11/22  Time of Visit:  1115  Visited with Patient   Visit length 15 Minutes   Referral source Nurse   Reason for visit Consult order      Spiritual Assessment     Spiritual practices & resources N/A   Areas of spiritual/emotional distress N/A   Distressful feelings N/A   Indicators of spiritual wellbeing N/A   Expressions of spiritual wellbeing N/A      Plan     Spiritual care intervention Other (Specify) (Chaplain numerous attempts to visit, pt sleeping; will try again at a later time.)   Outcomes (per patient/family) Neutral/No response   Spiritual care plans Continue to visit as needed   Additional comments N/A      Recommendation            Author:  Mitzi Hansen  11/11/2022 11:28 AM  Contact info: RR pager: [phone] ext: [phone]

## 2022-11-12 LAB — Glucose,POC
GLUCOSE,POC: 115 mg/dL — ABNORMAL HIGH (ref 65–99)
GLUCOSE,POC: 156 mg/dL — ABNORMAL HIGH (ref 65–99)
GLUCOSE,POC: 123 mg/dL — ABNORMAL HIGH (ref 65–99)
GLUCOSE,POC: 99 mg/dL (ref 65–99)

## 2022-11-12 LAB — Basic Metabolic Panel
ANION GAP: 7 mmol/L — ABNORMAL LOW (ref 8–19)
ANION GAP: 7 mmol/L — ABNORMAL LOW (ref 8–19)

## 2022-11-12 LAB — CBC: ABSOLUTE NUCLEATED RBC COUNT: 0 10*3/uL (ref 0.00–0.00)

## 2022-11-12 MED ADMIN — LACOSAMIDE 100 MG PO TABS: 100 mg | ORAL | @ 04:00:00 | Stop: 2022-11-17 | NDC 60687068711

## 2022-11-12 MED ADMIN — DEXAMETHASONE IVPB: 6 mg | INTRAVENOUS | @ 08:00:00 | Stop: 2022-11-12 | NDC 67457042300

## 2022-11-12 MED ADMIN — PSYLLIUM PO PACK (MULTI-GPI): 1 | ORAL | @ 04:00:00 | Stop: 2022-11-17

## 2022-11-12 MED ADMIN — DOCUSATE SODIUM 100 MG PO CAPS: 100 mg | ORAL | @ 04:00:00 | Stop: 2022-11-19 | NDC 00904718361

## 2022-11-12 MED ADMIN — CALCIUM CARBONATE ANTACID 500 (200 CA) MG PO CHEW: 1000 mg | ORAL | @ 04:00:00 | Stop: 2022-11-19 | NDC 68084098833

## 2022-11-12 MED ADMIN — DEXAMETHASONE IVPB: 4 mg | INTRAVENOUS | @ 18:00:00 | Stop: 2022-12-30 | NDC 67457042300

## 2022-11-12 MED ADMIN — CALCIUM CARBONATE ANTACID 500 (200 CA) MG PO CHEW: 1000 mg | ORAL | @ 16:00:00 | Stop: 2022-12-08 | NDC 68084098833

## 2022-11-12 MED ADMIN — HEPARIN SODIUM (PORCINE) 5000 UNIT/ML IJ SOLN: 5000 [IU] | SUBCUTANEOUS | @ 04:00:00 | Stop: 2022-11-15 | NDC 00409272330

## 2022-11-12 MED ADMIN — ACETAMINOPHEN 325 MG PO TABS: 650 mg | ORAL | @ 13:00:00 | Stop: 2022-11-18

## 2022-11-12 MED ADMIN — CALCIUM CARBONATE ANTACID 500 (200 CA) MG PO CHEW: 500 mg | ORAL | @ 22:00:00 | Stop: 2022-11-12 | NDC 68084098833

## 2022-11-12 MED ADMIN — DOCUSATE SODIUM 100 MG PO CAPS: 100 mg | ORAL | @ 16:00:00 | Stop: 2022-12-10 | NDC 00904718361

## 2022-11-12 MED ADMIN — PSYLLIUM PO PACK (MULTI-GPI): 1 | ORAL | @ 16:00:00 | Stop: 2022-11-17

## 2022-11-12 MED ADMIN — LEVETIRACETAM 500 MG PO TABS: 1000 mg | ORAL | @ 16:00:00 | Stop: 2022-11-14 | NDC 51079082101

## 2022-11-12 MED ADMIN — LEVETIRACETAM 500 MG PO TABS: 1000 mg | ORAL | @ 04:00:00 | Stop: 2022-11-14 | NDC 51079082101

## 2022-11-12 MED ADMIN — SODIUM CHLORIDE 0.9 % IV SOLN: @ 18:00:00 | Stop: 2022-11-12 | NDC 00338004902

## 2022-11-12 MED ADMIN — PANTOPRAZOLE SODIUM 40 MG PO TBEC: 40 mg | ORAL | @ 16:00:00 | Stop: 2022-11-19 | NDC 50268063911

## 2022-11-12 MED ADMIN — ACETAMINOPHEN 325 MG PO TABS: 650 mg | ORAL | @ 07:00:00 | Stop: 2022-11-18

## 2022-11-12 MED ADMIN — ACETAMINOPHEN 325 MG PO TABS: 650 mg | ORAL | @ 20:00:00 | Stop: 2022-11-18

## 2022-11-12 MED ADMIN — ACETAMINOPHEN 325 MG PO TABS: 650 mg | ORAL | @ 01:00:00 | Stop: 2022-11-18

## 2022-11-12 MED ADMIN — HEPARIN SODIUM (PORCINE) 5000 UNIT/ML IJ SOLN: 5000 [IU] | SUBCUTANEOUS | @ 16:00:00 | Stop: 2022-11-15 | NDC 00409272330

## 2022-11-12 MED ADMIN — LACOSAMIDE 100 MG PO TABS: 100 mg | ORAL | @ 16:00:00 | Stop: 2022-11-17 | NDC 60687068711

## 2022-11-12 NOTE — Consults
IP CM ACTIVE DISCHARGE PLANNING  Department of Care Coordination      Admit [phone]  Anticipated Date of Discharge: 11/14/2022    Following GN:FAOZH, Marquis Buggy., MD      Today's short update     S/W Selena Batten CM @ [facility] regarding [facility]kley Harvey. per Selena Batten, packet was not received yesterday and that she still need auth from [employer]. per Selena Batten, she will check and see if she received the packet yesterday and call this CM back.    0865 received call back from Selena Batten that she does not have the fax. Per Selena Batten, she does not have access to faxes over the weekend but will try and provide pre-auth once there is accepting facility. Pending accepting ARU.     1509 S/W daughter regarding ARU choice. Per daughter, she prefers [facility] in [place]. S/W Thayer Ohm @ [facility] regarding acceptance. Per Thayer Ohm, case is still under review and that it might happen Monday. Informed Jas Betten CM @ [facility]. CM to follow up tomorrow.     Disposition     Acute Rehab Unit  TBD  Family/Support System in agreement with the current discharge plan: Yes, in agreement and participating    DME or RT Equipment Status (if applicable)     Authorization in progress (4/6)    Facility Transfer/Placement Status (if applicable)     Pending post-acute dispo recs (1/7), Referral sent-out to providers (via Lois Huxley) (2/7)    Medical Transport Arrangement Status (if applicable)     Transportation needed (1/6)        Chalmers Cater  BSN, RN case Systems developer of Care Coordination and Clinical Social Work   [address]  [place], [place]  [address]   Phone: [phone] Fax: [phone]   Pager: [phone]   Email: [email or web address]

## 2022-11-12 NOTE — Progress Notes
NEUROSURGERY PROGRESS NOTE     DATE OF SERVICE:  11/12/2022    PATIENT: [name]        DOB: 05-Jul-1946    ATTENDING PHYSICIAN:  Ellyn Hack., MD    ADMISSION DATE:  11/04/2022    LENGTH OF STAY:  LOS: 8 days     CHIEF COMPLAINT: brain tumor     ACTIVE MEDICAL PROBLEMS:   Patient Active Problem List   Diagnosis    Brain mass       Present on Admission:   Brain mass      HPI:   [name] is a 77 y.o. year old female not on AC/AP who presents with 2 weeks of progressive speech difficulty that she describes as 'the words coming out wrong' as well as lethargy. Language problems are equally present when speaking in [place] (first language), [place] and [place]. Denies any memory problems other than what she considers normal aging. Pt reports that she is otherwise in her usual state of health and denies any seizures, confusion, receptive language difficulty, sensory deficits, or gross motor deficits. Otherwise, she is very active and functional at baseline.    INTERVAL EVENTS:   3/30 Consulted IMCS for pre operative clearance. Pending OR for 11/09/22 with Dr. Allena Katz .   3/31: Na 132 (134). Discont 75cc NaCl. 0.2mg  bid Florinef. 1g salt tab bid today. Neurointact at bedside including language. Sitting bedside, eating, walking. Plan for OR 4/3. Cleared IMCS.  4/1: AFVSS. NAEON. Na 134 (135). Florinef, salt tabs, 3% 12.5cc/h. Dex/Keppra. OR Weds 4/3.  4/2: AFVSS. NAEON. Na 136 (135). Florinef, salt tabs, 3% 12.5cc/h. Dex/Keppra. OR Weds 4/3. Functional MRI scheduled for 6 am.  3%NaCL stopped  NPO P MN/IVF  4/4 POD1 s/p L temporal crani for tumor; MRI pending. NAEON, AF VSS. WBC 34.87 (37.52), Hb 10.5 (11). Na 139. Foley. PT/OT.  4/5 POD2. AF VSS. MRI complete pending read. WBC 29. Hb 9.8. Na 140. Keppra to followup. Decadron taper to 4 tid indef. Discont foley yest. Dispo ARU per PT/OT. SLP cog eval complete.  4/6 awaiting ARU     OBJECTIVE:     Vital Signs:   Temp:  [36.5 ?C (97.7 ?F)-36.8 ?C (98.2 ?F)] 36.6 ?C (97.9 ?F)  Heart Rate:  [57-76] 68  Resp:  [14-20] 18  BP: (114-154)/(59-78) 114/59  NBP Mean:  [69-96] 69  SpO2:  [9 %-98 %] 98 %  On RA    GENERAL: Comfortable, breathing comfortably, resting in bed in no apparent distress    NEURO EXAM:  EOS  AO1 y/n  PERRL  English: Naming 0/3 (watch for clock), no repetition, word salad  Fcx4  Full strength    Chest: symmetrical chest rise, CTA in all lobes, NSR, (-)murmur  Vascular: 2+ pulses throughout (-)edema, cyanosis  ABD: soft, nontender, flat, normoactive bowel sounds LBM 4/1  Voiding    LINES AND DRAINS  CENTRAL LINE: None  DRAINS: None  FOLEY: None  SUTURE/STAPLE: 2 weeks post-op (4/17)    DATA   Pre-operative surgical visit and medical clearance visit documentation obtained and reviewed. Clinical lab tests reviewed daily. All associated imaging for this patient was independently reviewed and interpreted as listed below:    Post-op imaging:     11/11/22 MRI brain w/wo  Pending read    11/04/22 CT brain outside imaging   1. Large mixed density mass centered in the left anterior and mid temporal lobe with moderate perilesional vasogenic edema, regional mass effect  and midline shift from left to right at the level of septum pellucidum of 6 mm. 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  2. There is no evidence of subfalcine or uncal herniation.  3. No hydrocephalus.      ASSESSMENT AND PLAN:   38F admitted to inpatient pending OR, craniotomy for tumor resection.    NEURO  # Brain tumor  # Subacute aphasia (POA )  -S/p L temporal crani, tumor rxn  -Hyponatremia (normonatremic goal 135-145)  discont NaCl 75cc/h  cont 0.2 Flurinef bid  salt tabs 1g bid 3/31 am  3% NaCl 12.5cc/h 4/1 > stopped 4/2  Na daily starting 4/5  -Sutures: POD14, 4/18  -Post-op MRI brain w/wo  -Discont headwrap 4/5    Vasogenic edema (POA)   AED: Keppra 1000mg  po bid to follow-up, vimpat 100 mg bid x7 d (4/10)  Steroid: Dexamethasone 6mg  Q8 PO to 4 mg tid  Florinef 0.2mg  po bid x7 days   Suture/staple removal on POD#14   3/30 IMCS consulted for Preop clearance. Appreciate recs    Cleared for OR per IMCS    PAIN  No active issues  Tylenol and oxycodone PRN    RESPIRATORY  No active issues  Incentive spirometry    CARDIOVASCULAR  Hypertension  Bradycardia   Goal SBP 90-160  Keep K >4 and Mg >2  DC propranolol per IMCS    GU / RENAL  No active issues  Continue to monitor UOP    ID  Leukocytosis, afebrile 2/2 dexamethasone  Antibiotics: None   Continue to monitor WBC     HEME / ONC  No active issues  Keep Hgb >7  Continue to monitor H&H    ENDOCRINE  Hyperglycemia  Glucose goal 100-140   Glycemic control with SSI    GI / HEPATIC  No active issues  Diet: Regular > NPO P MN with IVF 50cc/hr   Nausea management: zofran PRN  Bowel regimen  Ulcer prophylaxis    PPx  DVT prophylaxis: SQH started today, SCDs    Disposition  PT/OT: ARU  SLP cog: auditory/expressive language 80% accuracy    The patient was examined and discussed with the resident Neurosurgery team & Attending Ellyn Hack., MD who agrees with assessment and plan listed above.    Author:    Lollie Marrow, NP   3:33 PM 11/12/2022  Department of Neurological Surgery

## 2022-11-12 NOTE — Progress Notes
Speech Language Pathology  Communication/Cognition Treatment Note    PATIENT: [name]  MRN: [id number]  DOB: Jan 05, 1946    Date of Treatment: 11/12/2022  Time: 1300   Length of Treatment: 30 minutes    Pain Assessment     Patient complains of pain: No    Treatment Provided     Patient awake and cooperative with therapy. Daughter [name]) at the bedside. Patient with modest improvements in functional language relative to a few days prior with some appropriate intelligible words embedded in jargon, however listener continues to carry the burden of communication. Patient appears motivated to improve and per her daughter is eager to transfer on to acute rehab.     Auditory Comprehension Short Term Goals  Pt will comprehend single sentences: when answering personally related yes/no questions  Accuracy: 80%  Goal Status: Continued    Spoken Expressive Language Short Term Goals  Pt will verbally complete common phrases: for questions related to personal information  Accuracy: 80%  Goal Status: Continued; Pt filled in common phrases in <50% of opportunities presented; Notably, she demonstrated imporved performance with automatic tasks. She sang Happy Birthday successfully. She was able to count 1-8 in English with phonemic paraphasic errors with # 9 and 10, bu able to count 1-10 fluently in [place] without error. She was able to state DOW and MOY in Portugese with >80% success (per collateral from daughter, acting as Nurse, learning disability).     Added:   Auditory Comprehension Short Term Goals  Pt will comprehend single sentences: when following 1-step instructions  Accuracy: 80%  Goal Status: Initial; Pt followed commands in <50% of opportunities (5/12), but able to follow SLP model in missed opportunities      Education     Patient Education: Yes ? Topic: Evaluation recommendations  ? Person Taught: child      ? Barrier: None   ? Needs: Needs review and New material    ? Teaching Method: Teacher, English as a foreign language and Verbal instruction  ? Outcome: Able to repeat information and/or demonstrate what was taught      Heywood Iles, SLP

## 2022-11-13 ENCOUNTER — Ambulatory Visit: Payer: Commercial Managed Care - Pharmacy Benefit Manager

## 2022-11-13 LAB — Glucose,POC
GLUCOSE,POC: 102 mg/dL — ABNORMAL HIGH (ref 65–99)
GLUCOSE,POC: 111 mg/dL — ABNORMAL HIGH (ref 65–99)
GLUCOSE,POC: 98 mg/dL (ref 65–99)
GLUCOSE,POC: 151 mg/dL — ABNORMAL HIGH (ref 65–99)

## 2022-11-13 LAB — Basic Metabolic Panel
CHLORIDE: 106 mmol/L (ref 96–106)
CREATININE: 0.56 mg/dL — ABNORMAL LOW (ref 0.60–1.30)

## 2022-11-13 LAB — CBC: HEMATOCRIT: 31.1 — ABNORMAL LOW (ref 34.9–45.2)

## 2022-11-13 MED ADMIN — LEVETIRACETAM 500 MG PO TABS: 1000 mg | ORAL | @ 15:00:00 | Stop: 2022-11-14 | NDC 51079082101

## 2022-11-13 MED ADMIN — PSYLLIUM PO PACK (MULTI-GPI): 1 | ORAL | @ 15:00:00 | Stop: 2022-11-17

## 2022-11-13 MED ADMIN — HEPARIN SODIUM (PORCINE) 5000 UNIT/ML IJ SOLN: 5000 [IU] | SUBCUTANEOUS | @ 05:00:00 | Stop: 2022-11-15 | NDC 00409272330

## 2022-11-13 MED ADMIN — CALCIUM CARBONATE ANTACID 500 (200 CA) MG PO CHEW: 1000 mg | ORAL | @ 05:00:00 | Stop: 2022-11-19 | NDC 68084098833

## 2022-11-13 MED ADMIN — DOCUSATE SODIUM 100 MG PO CAPS: 100 mg | ORAL | @ 05:00:00 | Stop: 2022-12-10 | NDC 00904718361

## 2022-11-13 MED ADMIN — ACETAMINOPHEN 325 MG PO TABS: 650 mg | ORAL | @ 12:00:00 | Stop: 2022-11-18 | NDC 71399801401

## 2022-11-13 MED ADMIN — ACETAMINOPHEN 325 MG PO TABS: 650 mg | ORAL | @ 18:00:00 | Stop: 2022-11-18

## 2022-11-13 MED ADMIN — DEXAMETHASONE IVPB: 4 mg | INTRAVENOUS | @ 07:00:00 | Stop: 2022-11-14

## 2022-11-13 MED ADMIN — LACOSAMIDE 100 MG PO TABS: 100 mg | ORAL | @ 15:00:00 | Stop: 2022-11-17 | NDC 60687068711

## 2022-11-13 MED ADMIN — INSULIN ASPART 100 UNIT/ML SC SOPN: 3 mL | SUBCUTANEOUS | @ 19:00:00 | Stop: 2022-12-10 | NDC 00169633910

## 2022-11-13 MED ADMIN — HEPARIN SODIUM (PORCINE) 5000 UNIT/ML IJ SOLN: 5000 [IU] | SUBCUTANEOUS | @ 15:00:00 | Stop: 2022-11-15 | NDC 00409272330

## 2022-11-13 MED ADMIN — MELATONIN 5 MG PO TABS: 5 mg | ORAL | @ 05:00:00 | Stop: 2022-12-13

## 2022-11-13 MED ADMIN — LACOSAMIDE 100 MG PO TABS: 100 mg | ORAL | @ 05:00:00 | Stop: 2022-11-17 | NDC 60687068711

## 2022-11-13 MED ADMIN — DEXAMETHASONE IVPB: 4 mg | INTRAVENOUS | @ 15:00:00 | Stop: 2022-11-14 | NDC 67457042300

## 2022-11-13 MED ADMIN — CALCIUM CARBONATE ANTACID 500 (200 CA) MG PO CHEW: 1000 mg | ORAL | @ 15:00:00 | Stop: 2022-11-19 | NDC 68084098833

## 2022-11-13 MED ADMIN — LEVETIRACETAM 500 MG PO TABS: 1000 mg | ORAL | @ 05:00:00 | Stop: 2022-11-14 | NDC 51079082101

## 2022-11-13 MED ADMIN — ACETAMINOPHEN 325 MG PO TABS: 650 mg | ORAL | @ 07:00:00 | Stop: 2022-11-18

## 2022-11-13 MED ADMIN — PANTOPRAZOLE SODIUM 40 MG PO TBEC: 40 mg | ORAL | @ 15:00:00 | Stop: 2022-11-19 | NDC 50268063911

## 2022-11-13 MED ADMIN — DOCUSATE SODIUM 100 MG PO CAPS: 100 mg | ORAL | @ 15:00:00 | Stop: 2022-11-19 | NDC 00904718361

## 2022-11-13 MED ADMIN — DEXAMETHASONE IVPB: 4 mg | INTRAVENOUS | @ 05:00:00 | Stop: 2022-11-14 | NDC 67457042300

## 2022-11-13 MED ADMIN — DEXAMETHASONE IVPB: 4 mg | INTRAVENOUS | @ 23:00:00 | Stop: 2022-11-14 | NDC 67457042254

## 2022-11-13 MED ADMIN — ACETAMINOPHEN 325 MG PO TABS: 650 mg | ORAL | @ 01:00:00 | Stop: 2022-11-18 | NDC 71399801401

## 2022-11-13 MED ADMIN — PSYLLIUM PO PACK (MULTI-GPI): 1 | ORAL | @ 05:00:00 | Stop: 2022-11-17

## 2022-11-13 NOTE — Other
Patient's Clinical Goal:   Clinical Goal(s) for the Shift: VSS, pain management, safety precautions  Identify possible barriers to advancing the care plan: none  Stability of the patient: Moderately Stable - low risk of patient condition declining or worsening   Progression of Patient's Clinical Goal: Patient rested during the night in intervals, no s/s of pain or nausea. No events during the night. Call bell in reach monitoring on going

## 2022-11-13 NOTE — Progress Notes
NEUROSURGERY PROGRESS NOTE     DATE OF SERVICE:  11/13/2022    PATIENT: [name]        DOB: Feb 25, 1946    ATTENDING PHYSICIAN:  Ellyn Hack., MD    ADMISSION DATE:  11/04/2022    LENGTH OF STAY:  LOS: 9 days     CHIEF COMPLAINT: brain tumor     ACTIVE MEDICAL PROBLEMS:   Patient Active Problem List   Diagnosis    Brain mass       Present on Admission:   Brain mass      HPI:   [name] is a 77 y.o. year old female not on AC/AP who presents with 2 weeks of progressive speech difficulty that she describes as 'the words coming out wrong' as well as lethargy. Language problems are equally present when speaking in [place] (first language), [place] and [place]. Denies any memory problems other than what she considers normal aging. Pt reports that she is otherwise in her usual state of health and denies any seizures, confusion, receptive language difficulty, sensory deficits, or gross motor deficits. Otherwise, she is very active and functional at baseline.    INTERVAL EVENTS:   3/30 Consulted IMCS for pre operative clearance. Pending OR for 11/09/22 with Dr. Allena Katz .   3/31: Na 132 (134). Discont 75cc NaCl. 0.2mg  bid Florinef. 1g salt tab bid today. Neurointact at bedside including language. Sitting bedside, eating, walking. Plan for OR 4/3. Cleared IMCS.  4/1: AFVSS. NAEON. Na 134 (135). Florinef, salt tabs, 3% 12.5cc/h. Dex/Keppra. OR Weds 4/3.  4/2: AFVSS. NAEON. Na 136 (135). Florinef, salt tabs, 3% 12.5cc/h. Dex/Keppra. OR Weds 4/3. Functional MRI scheduled for 6 am.  3%NaCL stopped  NPO P MN/IVF  4/4 POD1 s/p L temporal crani for tumor; MRI pending. NAEON, AF VSS. WBC 34.87 (37.52), Hb 10.5 (11). Na 139. Foley. PT/OT.  4/5 POD2. AF VSS. MRI complete pending read. WBC 29. Hb 9.8. Na 140. Keppra to followup. Decadron taper to 4 tid indef. Discont foley yest. Dispo ARU per PT/OT. SLP cog eval complete.  4/6 POD3. awaiting ARU   4/7: POD4. NAEON AF VSS. WBC 16.73 (19.37), Hb 10.2 (9.3). Na 139 (140). Dex 4 mg, gluc 116 +protonix. Keppra/Vimpat. Noted new left eye swelling, derm consult. Bmx2. PT/OT. SLP therapy for language. Pending ARU.     OBJECTIVE:     Vital Signs:   Temp:  [36.4 ?C (97.5 ?F)-37 ?C (98.6 ?F)] 36.4 ?C (97.5 ?F)  Heart Rate:  [58-68] 64  Resp:  [14-18] 16  BP: (107-148)/(50-94) 107/94  NBP Mean:  [69-101] 101  SpO2:  [95 %-98 %] 98 %  On RA    GENERAL: Comfortable, breathing comfortably, resting in bed in no apparent distress    NEURO EXAM:  Neurologic Exam:  EOS  AO3 in [place]  PERRL  English: Naming 3/4, + repetition, +paraphasic errors  Fcx4  Full strength  Incision open to air, c/d/i    Skin: new left eye swelling (photo in media tab)  Chest: symmetrical chest rise, CTA in all lobes, NSR, (-)murmur  Vascular: 2+ pulses throughout (-)edema, cyanosis  ABD: soft, nontender, flat, normoactive bowel sounds LBM 4/1  Voiding    LINES AND DRAINS  CENTRAL LINE: None  DRAINS: None  FOLEY: None  SUTURE/STAPLE: 2 weeks post-op (4/17)    DATA   Pre-operative surgical visit and medical clearance visit documentation obtained and reviewed. Clinical lab tests reviewed daily. All associated imaging for this patient  was independently reviewed and interpreted as listed below:    Post-op imaging:     11/11/22 MRI brain w/wo  Pending read    11/04/22 CT brain outside imaging   1. Large mixed density mass centered in the left anterior and mid temporal lobe with moderate perilesional vasogenic edema, regional mass effect and midline shift from left to right at the level of septum pellucidum of 6 mm. 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  2. There is no evidence of subfalcine or uncal herniation.  3. No hydrocephalus.      ASSESSMENT AND PLAN:   81F admitted to inpatient pending OR, craniotomy for tumor resection.    NEURO  # Brain tumor  # Subacute aphasia (POA )  -S/p L temporal crani, tumor rxn  -Hyponatremia (normonatremic goal 135-145)  discont NaCl 75cc/h  cont 0.2 Flurinef bid > stopped  salt tabs 1g bid 3/31 am > stopped  3% NaCl 12.5cc/h 4/1 > stopped 4/2  Na daily starting 4/5  -Sutures: POD14, 4/18  -Post-op MRI brain w/wo  -SLP speech/cognitive eval complete  -Discont headwrap 4/5    Vasogenic edema (POA)   AED: Keppra 1000mg  po bid to follow-up, vimpat 100 mg bid x7 d (4/10)  Steroid: Dexamethasone 6mg  Q8 PO to 4 mg tid  Florinef 0.2mg  po bid x7 days > OFF  Suture/staple removal on POD#14   3/30 IMCS consulted for Preop clearance. Appreciate recs    Cleared for OR per IMCS    PAIN  No active issues  Tylenol and oxycodone PRN    RESPIRATORY  No active issues  Incentive spirometry    CARDIOVASCULAR  Hypertension  Bradycardia   Goal SBP 90-160  Keep K >4 and Mg >2  DC propranolol per IMCS    GU / RENAL  No active issues  Continue to monitor UOP    ID  Leukocytosis, afebrile 2/2 dexamethasone  Antibiotics: None   Continue to monitor WBC     HEME / ONC  No active issues  Keep Hgb >7  Continue to monitor H&H    ENDOCRINE  Hyperglycemia  Glucose goal 100-140   Glycemic control with SSI    GI / HEPATIC  No active issues  Diet: Regular   Nausea management: zofran PRN  Bowel regimen  Ulcer prophylaxis    INTEGUMENTARY  # Left eye swelling  New on 4/7  Dermatology consult  Rec focal U.S.    PPx  DVT prophylaxis: SQH started today, SCDs    Disposition  PT/OT: ARU  SLP cog: auditory/expressive language 80% accuracy    The patient was examined and discussed with the resident Neurosurgery team & Attending Ellyn Hack., MD who agrees with assessment and plan listed above.    Author:    Pryor Ochoa, MD   7:51 AM 11/13/2022  Department of Neurological Surgery

## 2022-11-13 NOTE — Other
Patient's Clinical Goal:   Clinical Goal(s) for the Shift: VSS, pain management, safety precautions  Identify possible barriers to advancing the care plan:   Stability of the patient: Moderately Unstable - medium risk of patient condition declining or worsening    Progression of Patient's Clinical Goal: A/Ox3-4, VSS on RA, BMAT 3 oob with cane. Noted new blister left lower eye, verbally informed MD at hallway. Denies pain. Pending ARU placement

## 2022-11-14 ENCOUNTER — Ambulatory Visit: Payer: Commercial Managed Care - Pharmacy Benefit Manager

## 2022-11-14 ENCOUNTER — Ambulatory Visit: Payer: PRIVATE HEALTH INSURANCE

## 2022-11-14 LAB — UA,Dipstick: BILIRUBIN: NEGATIVE (ref 1.005–1.030)

## 2022-11-14 LAB — Glucose,POC
GLUCOSE,POC: 101 mg/dL — ABNORMAL HIGH (ref 65–99)
GLUCOSE,POC: 108 mg/dL — ABNORMAL HIGH (ref 65–99)
GLUCOSE,POC: 88 mg/dL (ref 65–99)
GLUCOSE,POC: 97 mg/dL (ref 65–99)

## 2022-11-14 LAB — UA,Microscopic: SQUAMOUS EPITHELIAL CELLS: 2 {cells}/uL (ref 0–17)

## 2022-11-14 LAB — Procalcitonin: PROCALCITONIN: <0.1 ug/L (ref <0.10)

## 2022-11-14 LAB — Basic Metabolic Panel
CHLORIDE: 103 mmol/L (ref 96–106)
SODIUM: 139 mmol/L (ref 135–146)

## 2022-11-14 LAB — Blood Lactate: BLOOD LACTATE: 31 mg/dL — ABNORMAL HIGH (ref 5–18)

## 2022-11-14 LAB — CBC: HEMATOCRIT: 29.7 — ABNORMAL LOW (ref 34.9–45.2)

## 2022-11-14 MED ADMIN — PLASMA-LYTE A IV SOLN: INTRAVENOUS | @ 22:00:00 | Stop: 2022-11-15 | NDC 00338022104

## 2022-11-14 MED ADMIN — DOCUSATE SODIUM 100 MG PO CAPS: 100 mg | ORAL | @ 04:00:00 | Stop: 2022-11-19 | NDC 00904718361

## 2022-11-14 MED ADMIN — LEVETIRACETAM 500 MG PO TABS: 1000 mg | ORAL | @ 04:00:00 | Stop: 2023-01-10 | NDC 51079082101

## 2022-11-14 MED ADMIN — PROPOFOL 200 MG/20ML IV EMUL: INTRAVENOUS | Stop: 2022-11-15 | NDC 63323026929

## 2022-11-14 MED ADMIN — ACETAMINOPHEN 325 MG PO TABS: 650 mg | ORAL | @ 19:00:00 | Stop: 2022-11-18 | NDC 71399801401

## 2022-11-14 MED ADMIN — ROCURONIUM BROMIDE 50 MG/5ML IV SOLN: INTRAVENOUS | @ 22:00:00 | Stop: 2022-11-15 | NDC 39822420002

## 2022-11-14 MED ADMIN — IOHEXOL 350 MG/ML IV SOLN: 100 mL | INTRAVENOUS | @ 20:00:00 | Stop: 2022-11-14 | NDC 00407141491

## 2022-11-14 MED ADMIN — FENTANYL CITRATE (PF) 100 MCG/2ML IJ SOLN: INTRAVENOUS | @ 23:00:00 | Stop: 2022-11-15 | NDC 00409909422

## 2022-11-14 MED ADMIN — EPHEDRINE SULFATE (PRESSORS) 50 MG/ML IV SOLN: INTRAVENOUS | @ 22:00:00 | Stop: 2022-11-15 | NDC 51754420004

## 2022-11-14 MED ADMIN — PANTOPRAZOLE SODIUM 40 MG PO TBEC: 40 mg | ORAL | @ 16:00:00 | Stop: 2022-11-19 | NDC 50268063911

## 2022-11-14 MED ADMIN — PSYLLIUM PO PACK (MULTI-GPI): 1 | ORAL | @ 04:00:00 | Stop: 2022-12-10

## 2022-11-14 MED ADMIN — ROCURONIUM BROMIDE 50 MG/5ML IV SOLN: INTRAVENOUS | Stop: 2022-11-15 | NDC 39822420002

## 2022-11-14 MED ADMIN — CALCIUM CARBONATE ANTACID 500 (200 CA) MG PO CHEW: 1000 mg | ORAL | @ 04:00:00 | Stop: 2022-12-08 | NDC 68084098833

## 2022-11-14 MED ADMIN — PSYLLIUM PO PACK (MULTI-GPI): 1 | ORAL | @ 16:00:00 | Stop: 2022-11-17

## 2022-11-14 MED ADMIN — LACOSAMIDE 100 MG PO TABS: 100 mg | ORAL | @ 04:00:00 | Stop: 2022-11-17 | NDC 60687068711

## 2022-11-14 MED ADMIN — CEFAZOLIN SODIUM 1 G IJ SOLR: INTRAVENOUS | @ 22:00:00 | Stop: 2022-11-15 | NDC 60505614205

## 2022-11-14 MED ADMIN — LEVETIRACETAM 500 MG/5ML IV SOLN: INTRAVENOUS | @ 22:00:00 | Stop: 2022-11-14 | NDC 00409188602

## 2022-11-14 MED ADMIN — DOCUSATE SODIUM 100 MG PO CAPS: 100 mg | ORAL | @ 16:00:00 | Stop: 2022-12-10 | NDC 00904718361

## 2022-11-14 MED ADMIN — PAPAVERINE HCL 30 MG/ML IJ SOLN: Stop: 2022-11-15 | NDC 54288014210

## 2022-11-14 MED ADMIN — THROMBIN 5,000 UNITS, GELFOAM 1G POWDER WITH 0.9% NACL: Stop: 2022-11-15

## 2022-11-14 MED ADMIN — HYDROMORPHONE HCL 2 MG/ML IJ SOLN: INTRAVENOUS | Stop: 2022-11-15 | NDC 00641615125

## 2022-11-14 MED ADMIN — HEPARIN SODIUM (PORCINE) 5000 UNIT/ML IJ SOLN: 5000 [IU] | SUBCUTANEOUS | @ 16:00:00 | Stop: 2022-11-15 | NDC 00409272330

## 2022-11-14 MED ADMIN — THROMBIN 5,000 UNITS WITH GELFOAM SPONGE 100: Stop: 2022-11-15

## 2022-11-14 MED ADMIN — DEXAMETHASONE IVPB: 4 mg | INTRAVENOUS | @ 07:00:00 | Stop: 2022-11-14 | NDC 67457042254

## 2022-11-14 MED ADMIN — FENTANYL CITRATE (PF) 100 MCG/2ML IJ SOLN: INTRAVENOUS | @ 22:00:00 | Stop: 2022-11-15 | NDC 00409909422

## 2022-11-14 MED ADMIN — ACETAMINOPHEN 325 MG PO TABS: 650 mg | ORAL | @ 01:00:00 | Stop: 2022-11-18 | NDC 71399801401

## 2022-11-14 MED ADMIN — LACOSAMIDE 100 MG PO TABS: 100 mg | ORAL | @ 16:00:00 | Stop: 2022-11-17 | NDC 60687068711

## 2022-11-14 MED ADMIN — DEXAMETHASONE SODIUM PHOSPHATE 4 MG/ML IJ SOLN: INTRAVENOUS | @ 22:00:00 | Stop: 2022-11-15 | NDC 67457042312

## 2022-11-14 MED ADMIN — LEVETIRACETAM 500 MG PO TABS: 1000 mg | ORAL | @ 16:00:00 | Stop: 2022-11-14 | NDC 51079082101

## 2022-11-14 MED ADMIN — SUCCINYLCHOLINE CHLORIDE 20 MG/ML IJ SOLN: INTRAVENOUS | @ 22:00:00 | Stop: 2022-11-15 | NDC 00781341195

## 2022-11-14 MED ADMIN — DEXAMETHASONE 4 MG PO TABS: 8 mg | ORAL | @ 17:00:00 | Stop: 2022-11-14 | NDC 60687071811

## 2022-11-14 MED ADMIN — PROPOFOL 200 MG/20ML IV EMUL: INTRAVENOUS | @ 22:00:00 | Stop: 2022-11-15 | NDC 63323026929

## 2022-11-14 MED ADMIN — HEPARIN SODIUM (PORCINE) 5000 UNIT/ML IJ SOLN: 5000 [IU] | SUBCUTANEOUS | @ 04:00:00 | Stop: 2022-11-15 | NDC 00409272330

## 2022-11-14 MED ADMIN — ACETAMINOPHEN 325 MG PO TABS: 650 mg | ORAL | @ 07:00:00 | Stop: 2022-11-18

## 2022-11-14 MED ADMIN — LIDOCAINE HCL (PF) 1 % IJ SOLN: INTRAVENOUS | @ 22:00:00 | Stop: 2022-11-15 | NDC 63323049237

## 2022-11-14 MED ADMIN — CEFAZOLIN SODIUM 1 G IJ SOLR: Stop: 2022-11-15 | NDC 60505614205

## 2022-11-14 MED ADMIN — CALCIUM CARBONATE ANTACID 500 (200 CA) MG PO CHEW: 1000 mg | ORAL | @ 16:00:00 | Stop: 2022-11-19 | NDC 68084098833

## 2022-11-14 MED ADMIN — LEVETIRACETAM 500 MG/5ML IV SOLN: INTRAVENOUS | @ 22:00:00 | Stop: 2022-11-14

## 2022-11-14 MED ADMIN — HYDROXYZINE HCL 25 MG PO TABS: 25 mg | ORAL | @ 01:00:00 | Stop: 2022-12-11 | NDC 00904661761

## 2022-11-14 MED ADMIN — DEXAMETHASONE IVPB: 4 mg | INTRAVENOUS | @ 17:00:00 | Stop: 2022-11-14

## 2022-11-14 MED ADMIN — ACETAMINOPHEN 325 MG PO TABS: 650 mg | ORAL | @ 13:00:00 | Stop: 2022-11-18

## 2022-11-14 MED ADMIN — MELATONIN 5 MG PO TABS: 5 mg | ORAL | @ 04:00:00 | Stop: 2022-11-19

## 2022-11-14 NOTE — Progress Notes
Neurosurgery Attending Note    Patient with left temporal resection of glioblastoma on 4/3. Doing well post-op awaiting rehab with improvement in language function. Noticeable decrease in language function this morning. No worsening weakness or pronator drift. CT shows hematoma with mass effect on inferior frontal gyrus consistent with new worsening expressive aphasia. We will plan for OR for evacuation to restore language function and decrease mass effect. I discussed this procedure with the daughter and she is in agreement as pt has worsened this am.      Marquis Buggy. Allena Katz  Assistant Professor  Department of Neurosurgery  [employer]  [place] [facility] [place]  [phone]

## 2022-11-14 NOTE — Progress Notes
Physical Therapy Treatment      PATIENT: [name]  MRN: [id number]    Treatment Date: 11/14/2022    Patient Presentation: Position: In bed;Bed alarm on;SCD's  Lines/devices Drains: IV/PICC;Cardiac Monitor;Pulse Ox  Precautions   Precautions: Fall risk;Craniotomy;Monitor Vitals;Check Labs  Orthotic: None  Current Activity Order: Order implies OOB  Weight Bearing Status: Not Applicable  Cognition   Cognition: Exceptions to WDL  Arousal/Alertness: Delayed responses to stimuli  Attention Span: Attends with cues to redirect;Difficulty dividing attention  Memory: Decreased recall of precautions  Orientation Level: Oriented to person;Oriented to place  Following Commands: Follows one step commands with increased time;Follows one step commands with repetition  Safety Awareness: Poor awareness of safety precautions;Decreased awareness of need for assistance  Barriers to Learning: Cognitive Limitations;Altered Mental Status;Language (daughter assisting with Portugese interpretation per pt/family preference)  Bed Mobility   Supine to Sit: Minimum Assist;Verbal Cueing;Side Rails;to Left  Sit to Supine: Not Performed (Left up in chair)  Functional Mobility   Sit to Stand: Minimum Assist;Verbal Cueing  Ambulation: Minimum Assist;Verbal Cueing  Ambulation Distance (Feet): 52ft+30ft  Gait Pattern: Decreased weight shift;Decreased stride length;Decreased pace;Narrow Base of Support;Decreased heel-toe  Assistive Device: Front wheeled walker     Balance   Sitting - Static: with UE support (fair+)  Sitting - Dynamic: Able to weight shift anteriorly;Able to weight shift posteriorly;Able to weight shift laterally;Able to weight shift alternating sides;with UE support  Standing - Static: Requires MIN A to maintain;with UE support  Standing - Dynamic: Requires MIN A to recover;with UE support  Neurological (if indicated)   Neuro Deficits: Yes  Inattention/Neglect: Appears Intact  Midline Orientation: Grossly Intact  Motor Planning: Cues to initiate activity;Assist to initiate activity  Motor Control: Grossly intact all extremities  Coordination: Decreased speed;Decreased accuracy;Impaired finger to nose  Perseveration: Not present  RUE Tone: Within Functional Limits  LUE Tone: Within Functional Limits  RLE Tone: Within Functional Limits  LLE Tone: Within Functional Limits  Head Control and Alignment: Within Functional Limits  Trunk Control: Within Functional Limits  Pain Assessment   Patient complains of pain: No    Patient Status   Activity Tolerance: Good  Oxygen Needs: Room Air  Response to Treatment: Tolerated treatment well;Vital signs stable;Nursing notified  Compliance with Precautions: Fair  Call light in reach: Yes  Presentation post treatment: Up in chair;On cardiac monitor;Lines/drains intact;Pulse Ox;Body holder on;Chair alarm on;Family/CG present  Comments: Pt educated on craniotomy precautions. Pt with improved balance this session with FWW, no LOB or buckling noted however demos difficulty with coordination and crossing midline for dynamic hand taps and seated TherEX. Pt holds aphasia and has constant cues for reorientation to task at hand. Left seated in chair with all needs met.  Interdisciplinary Communication   Interdisciplinary Communication: Nurse;Physician;Occupational Therapist  PT Recommendations   Discharge Recommendation: Physical Therapy;Would benefit from intensive therapy upon discharge  Discharge concerns: Requires assistance for mobility;Requires assistance for self care  Discharge Equipment Recommended: Defer to discharge facility;If patient dc home instead of rehab as recommended;Walker    Treatment Completed by: Salome Spotted, PTA

## 2022-11-14 NOTE — Progress Notes
NEUROSURGERY PROGRESS NOTE     DATE OF SERVICE:  11/14/2022    PATIENT: [name]        DOB: 11-07-1945    ATTENDING PHYSICIAN:  Ellyn Hack., MD    ADMISSION DATE:  11/04/2022    LENGTH OF STAY:  LOS: 10 days     CHIEF COMPLAINT: brain tumor     ACTIVE MEDICAL PROBLEMS:   Patient Active Problem List   Diagnosis    Brain mass       Present on Admission:   Brain mass      HPI:   [name] is a 77 y.o. year old female not on AC/AP who presents with 2 weeks of progressive speech difficulty that she describes as 'the words coming out wrong' as well as lethargy. Language problems are equally present when speaking in [place] (first language), [place]., and [place]. Denies any memory problems other than what she considers normal aging. Pt reports that she is otherwise in her usual state of health and denies any seizures, confusion, receptive language difficulty, sensory deficits, or gross motor deficits. Otherwise, she is very active and functional at baseline.    INTERVAL EVENTS:   3/30 Consulted IM[facility] for pre operative clearance. Pending OR for 11/09/22 with Dr. Allena Katz .   3/31: Na 132 (134). Discont 75cc NaCl. 0.2mg  bid Florinef. 1g salt tab bid today. Neurointact at bedside including language. Sitting bedside, eating, walking. Plan for OR 4/3. Cleared IMCS.  4/1: AFVSS. NAEON. Na 134 (135). Florinef, salt tabs, 3% 12.5cc/h. Dex/Keppra. OR Weds 4/3.  4/2: AFVSS. NAEON. Na 136 (135). Florinef, salt tabs, 3% 12.5cc/h. Dex/Keppra. OR Weds 4/3. Functional MRI scheduled for 6 am.  3%NaCL stopped  NPO P MN/IVF  4/4 POD1 s/p L temporal crani for tumor; MRI pending. NAEON, AF VSS. WBC 34.87 (37.52), Hb 10.5 (11). Na 139. Foley. PT/OT.  4/5 POD2. AF VSS. MRI complete pending read. WBC 29. Hb 9.8. Na 140. Keppra to followup. Decadron taper to 4 tid indef. Discont foley yest. Dispo ARU per PT/OT. SLP cog eval complete.  4/6 POD3. awaiting ARU   4/7: POD4. NAEON AF VSS. WBC 16.73 (19.37), Hb 10.2 (9.3). Na 139 (140). Dex 4 mg, gluc 116 +protonix. Keppra/Vimpat. Noted new left eye swelling, derm consult. Bmx2. PT/OT. SLP therapy for language. Pending ARU.   4/8 Pt experiencing global aphasia with NO extremity weakness.  CT perfusion brain and will r/o any potential infections. Dexamethasone increased to 8mg  .      OBJECTIVE:     Vital Signs:   Temp:  [36 ?C (96.8 ?F)-36.7 ?C (98.1 ?F)] 36.7 ?C (98.1 ?F)  Heart Rate:  [54-77] 56  Resp:  [16-20] 19  BP: (107-155)/(57-73) 140/72  NBP Mean:  [70-90] 90  SpO2:  [95 %-97 %] 95 %  On RA    GENERAL: Comfortable, breathing comfortably, resting in bed in no apparent distress    NEURO EXAM:  Awake, alert and oriented x 3.   Able to nod to for answers.  Nonverbal  PERRL. Follows commands   English: Naming and repetition (unable to participate)  Full strength  Incision open to air, c/d/i    Skin: new left eye swelling (photo in media tab) numbness per patient   Chest: symmetrical chest rise, CTA in all lobes, NSR, (-)murmur  Vascular: 2+ pulses throughout (-)edema, cyanosis  ABD: soft, nontender, flat, normoactive bowel sounds   Voiding    LINES AND DRAINS  CENTRAL LINE: None  DRAINS: None  FOLEY: None  SUTURE/STAPLE: 2 weeks post-op (4/17)    DATA   Pre-operative surgical visit and medical clearance visit documentation obtained and reviewed. Clinical lab tests reviewed daily. All associated imaging for this patient was independently reviewed and interpreted as listed below:    Post-op imaging:     11/11/22 MRI brain w/wo  Pending read    11/04/22 CT brain outside imaging   1. Large mixed density mass centered in the left anterior and mid temporal lobe with moderate perilesional vasogenic edema, regional mass effect and midline shift from left to right at the level of septum pellucidum of 6 mm. 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  2. There is no evidence of subfalcine or uncal herniation.  3. No hydrocephalus.      ASSESSMENT AND PLAN:   75F admitted to inpatient pending OR, craniotomy for tumor resection.    NEURO  Brain tumor s/p L temporal crani, tumor rxn 4/3  Subacute aphasia (POA )  Hyponatremia (normonatremic goal 135-145)  cont 0.2 Flurinef bid > stopped  salt tabs 1g bid 3/31 am > stopped  3% NaCl 12.5cc/h 4/1 > stopped 4/2  Na daily starting 4/5  SLP speech/cognitive eval complete  4/8 CT brain perfusion today     Vasogenic edema (POA)   AED: Keppra 1000mg  po bid to follow-up, vimpat 100 mg bid x7 d (4/10)  Steroid: 4/8 Dexamethasone increased to 8mg  Q8 PO taper to off   Suture/staple removal on POD#14 . 4/18      PAIN  Post operative pain   Tylenol and oxycodone PRN    RESPIRATORY  No active issues  Incentive spirometry    CARDIOVASCULAR  Hypertension  Bradycardia   Goal SBP 90-160  Keep K >4 and Mg >2  DC propranolol per IMCS    GU / RENAL  No active issues  Continue to monitor UOP    ID  Leukocytosis, afebrile 2/2 dexamethasone  Antibiotics: None   Continue to monitor WBC  F/U UA, procal, lactate      HEME / ONC  No active issues  Keep Hgb >7  Continue to monitor H&H    ENDOCRINE  Hyperglycemia  Glucose goal 100-140   Glycemic control with SSI    GI / HEPATIC  Constipation   Diet: Regular   Nausea management: zofran PRN  Bowel regimen  Ulcer prophylaxis    INTEGUMENTARY  Left eye swelling  New on 4/7  Dermatology consult  Rec focal U.S.    PPx  DVT prophylaxis: SQH, SCDs    Disposition  PT/OT: ARU  SLP cog: auditory/expressive language 80% accuracy    The patient was examined and discussed with the resident Neurosurgery team & Attending Ellyn Hack., MD who agrees with assessment and plan listed above.    Author:    Lollie Marrow, NP   10:00 AM 11/14/2022  Department of Neurological Surgery

## 2022-11-14 NOTE — Progress Notes
Speech Language Pathology      PATIENT: [name]   Date: 11/14/2022  Time: 12:50 PM      Service Provided   Cognitive-Communication therapy attempted, but pt off the floor. Will place intervention on hold and re-attempt in 1-3 days as able/appropriate.     [address] [place], [place]   [id number]  [id number]

## 2022-11-14 NOTE — Other
Patient's Clinical Goal:   Clinical Goal(s) for the Shift: vss, comfort, safety  Identify possible barriers to advancing the care plan: NA  Stability of the patient: Moderately Unstable - medium risk of patient condition declining or worsening    Progression of Patient's Clinical Goal: Per patient's daughter, ''it seems patient is going backward as patient is not speaking today'' MD notified. Patient was non verbal today. Patient was able to answer questions by nodding head. Patient is ambulatory with FWW. Incision c/d/I and blister on L eye. Patient was taken for emergency procedure d/t CT result. Dtr at bedside.

## 2022-11-14 NOTE — Progress Notes
Occupational Therapy Treatment    PATIENT: [name]  MRN: [id number]    Treatment Date: 11/14/2022    Patient Presentation: Position: In bed;Bed alarm on  Lines/devices Drains: HLIV;Pulse Ox    Precautions   Precautions: Fall risk;Craniotomy;Monitor Vitals;Check Labs  Orthotic: None  Current Activity Order: Order implies OOB  Weight Bearing Status: Not Applicable    Cognition   Cognition: Exceptions to WDL or Baseline Status  Arousal/Alertness: Appropriate responses to stimuli  Attention Span: Attends with cues to redirect  Following Commands: Follows 2-3 step commands with repetition;Follows 2-3 step commands with increased time  Problem Solving: Assistance required to identify errors made  Sequencing: Intact  Initiation: Fair  Safety Awareness: Fair awareness of safety precautions  Barriers to Learning: Cognitive Limitations;Language (Pt with expressive aphasia)    Bed Mobility   Supine to Sit: Stand by Assist;to Left  Sit to Supine: Not Performed    Functional Transfers   Sit to Stand: Stand by Assist;Assistive Device (Comment)  Functional Mobility: Stand by Assist;Front wheeled walker    Activities of Daily Living (ADLs)   Grooming: Performed  Grooming Assistance: Stand by Assist  Grooming Deficit: Verbal cueing;Supervision/safety  Grooming Adaptive Equipment: None  Grooming Where Assessed: Standing sinkside  UB Dressing: Performed (Donned robe)  UB Dressing Assistance: Stand by Assist  UB Dressing Deficit: Supervision/safety  UB Dressing Adaptive Equipment: None  UB Dressing Where Assessed: Standing  LB Dressing: Performed  LB Dressing Assistance: Stand by Assist (Donned pants)  LB Dressing Deficit: Supervision/safety  LB Dressing Adaptive Equipment: None  LB Dressing Where Assessed: Edge of bed;Standing    Balance   Sitting - Static: Good  Sitting - Dynamic: Good     Neurological (if indicated)   Neuro Deficits: Yes  Inattention/Neglect: Appears Intact  Midline Orientation: Grossly Intact  Motor Planning: Cues to initiate activity;Sequencing deficit  Motor Control: Grossly intact all extremities  Coordination: Decreased accuracy  Perseveration: Not present  RUE Tone: Within Functional Limits  LUE Tone: Within Functional Limits    Interventions   Balance Training: Standing weight shifting all planes  Fine Motor Training: Grooming tasks    Pain Assessment   Patient complains of pain: No      Patient Status   Activity Tolerance: Good  Oxygen Needs: Room Air  Response to Treatment: Tolerated treatment well  Compliance with Precautions: Fair  Call light in reach: Yes  Presentation post treatment: Up in chair;Lines/drains intact;Chair alarm on    Interdisciplinary Communication   Interdisciplinary Communication: Nurse    Treatment Plan   Continue OT Treatment Plan with Focus on: ADL training;Functional mobility training;Patient/family/caregiver education and training    OT Recommendations   Discharge Recommendation: Occupational Therapy;Would benefit from intensive therapy upon discharge;Able to tolerate 3+ hours of therapy per day  Discharge concerns: Requires assistance for mobility;Requires assistance for self care;Impaired cognition;Poor safety awareness  Discharge Equipment Recommended: Defer to discharge facility  Comments: Pt non-verbal throughout session, appears to demonstrate expressive aphasia. Pt able to follow majority of commands however no verbal output despite attempts of communicating. Pt relied on getures as well as head nods to answer questions.    Treatment Completed by: Ernesta Amble, OT

## 2022-11-15 ENCOUNTER — Ambulatory Visit: Payer: PRIVATE HEALTH INSURANCE

## 2022-11-15 LAB — CBC: HEMATOCRIT: 31.1 — ABNORMAL LOW (ref 34.9–45.2)

## 2022-11-15 LAB — Tissue Exam

## 2022-11-15 LAB — Glucose,POC
GLUCOSE,POC: 150 mg/dL — ABNORMAL HIGH (ref 65–99)
GLUCOSE,POC: 121 mg/dL — ABNORMAL HIGH (ref 65–99)
GLUCOSE,POC: 111 mg/dL — ABNORMAL HIGH (ref 65–99)

## 2022-11-15 LAB — Basic Metabolic Panel
CREATININE: 0.59 mg/dL — ABNORMAL LOW (ref 0.60–1.30)
CREATININE: 0.59 mg/dL — ABNORMAL LOW (ref 0.60–1.30)

## 2022-11-15 MED ADMIN — PSYLLIUM PO PACK (MULTI-GPI): 1 | ORAL | @ 16:00:00 | Stop: 2022-12-10

## 2022-11-15 MED ADMIN — CALCIUM CARBONATE ANTACID 500 (200 CA) MG PO CHEW: 1000 mg | ORAL | @ 16:00:00 | Stop: 2022-11-19 | NDC 68084098833

## 2022-11-15 MED ADMIN — CEFAZOLIN SODIUM-DEXTROSE 2-4 GM/100ML-% IV SOLN: 2 g | INTRAVENOUS | @ 07:00:00 | Stop: 2022-11-15 | NDC 00338350841

## 2022-11-15 MED ADMIN — ACETAMINOPHEN 325 MG PO TABS: 650 mg | ORAL | @ 16:00:00 | Stop: 2022-11-18 | NDC 71399801401

## 2022-11-15 MED ADMIN — ACETAMINOPHEN 325 MG PO TABS: 650 mg | ORAL | @ 06:00:00 | Stop: 2022-11-18 | NDC 71399801401

## 2022-11-15 MED ADMIN — DEXAMETHASONE 4 MG PO TABS: 8 mg | ORAL | @ 06:00:00 | Stop: 2022-11-16 | NDC 60687071811

## 2022-11-15 MED ADMIN — ACETAMINOPHEN 10 MG/ML IV SOLN: 1000 mg | INTRAVENOUS | @ 03:00:00 | Stop: 2022-11-15 | NDC 63323043441

## 2022-11-15 MED ADMIN — IOHEXOL 350 MG/ML IV SOLN: 100 mL | INTRAVENOUS | @ 06:00:00 | Stop: 2022-11-15 | NDC 00407141491

## 2022-11-15 MED ADMIN — LABETALOL HCL 5 MG/ML IV SOLN: @ 02:00:00 | Stop: 2022-11-15 | NDC 72266010201

## 2022-11-15 MED ADMIN — PANTOPRAZOLE SODIUM 40 MG PO TBEC: 40 mg | ORAL | @ 16:00:00 | Stop: 2022-12-08 | NDC 50268063911

## 2022-11-15 MED ADMIN — PROPOFOL 200 MG/20ML IV EMUL: INTRAVENOUS | Stop: 2022-11-15 | NDC 63323026929

## 2022-11-15 MED ADMIN — ACETAMINOPHEN 325 MG PO TABS: 650 mg | ORAL | @ 23:00:00 | Stop: 2022-11-18 | NDC 71399801401

## 2022-11-15 MED ADMIN — HEPARIN SODIUM (PORCINE) 5000 UNIT/ML IJ SOLN: 5000 [IU] | SUBCUTANEOUS | @ 16:00:00 | Stop: 2022-11-15 | NDC 00409272330

## 2022-11-15 MED ADMIN — LABETALOL HCL 5 MG/ML IV SOLN: 5 mg | INTRAVENOUS | @ 02:00:00 | Stop: 2022-11-15

## 2022-11-15 MED ADMIN — DEXAMETHASONE 4 MG PO TABS: 8 mg | ORAL | @ 16:00:00 | Stop: 2022-11-16 | NDC 60687071811

## 2022-11-15 MED ADMIN — CEFAZOLIN SODIUM-DEXTROSE 2-4 GM/100ML-% IV SOLN: 2 g | INTRAVENOUS | @ 23:00:00 | Stop: 2022-11-15 | NDC 00338350841

## 2022-11-15 MED ADMIN — LACOSAMIDE 100 MG PO TABS: 100 mg | ORAL | @ 16:00:00 | Stop: 2022-11-17 | NDC 60687068711

## 2022-11-15 MED ADMIN — ACETAMINOPHEN 10 MG/ML IV SOLN: 1000 mg | INTRAVENOUS | @ 02:00:00 | Stop: 2022-11-15 | NDC 63323043441

## 2022-11-15 MED ADMIN — DOCUSATE SODIUM 100 MG PO CAPS: 100 mg | ORAL | @ 16:00:00 | Stop: 2022-12-10 | NDC 00904718361

## 2022-11-15 MED ADMIN — DEXAMETHASONE 4 MG PO TABS: 8 mg | ORAL | @ 23:00:00 | Stop: 2022-11-16 | NDC 66993073051

## 2022-11-15 MED ADMIN — SUGAMMADEX SODIUM 200 MG/2ML IV SOLN: INTRAVENOUS | Stop: 2022-11-15 | NDC 00006542312

## 2022-11-15 MED ADMIN — CEFAZOLIN SODIUM-DEXTROSE 2-4 GM/100ML-% IV SOLN: 2 g | INTRAVENOUS | @ 16:00:00 | Stop: 2022-11-15 | NDC 00338350841

## 2022-11-15 MED ADMIN — LACOSAMIDE 100 MG PO TABS: 100 mg | ORAL | @ 06:00:00 | Stop: 2022-11-17 | NDC 60687068711

## 2022-11-15 MED ADMIN — SODIUM CHLORIDE 0.9 % IV SOLN: 75 mL/h | INTRAVENOUS | @ 07:00:00 | Stop: 2022-11-16 | NDC 00338004902

## 2022-11-15 MED ADMIN — HYDROMORPHONE HCL 1 MG/ML IJ SOLN: .2 mg | INTRAVENOUS | @ 02:00:00 | Stop: 2022-11-15 | NDC 00409128331

## 2022-11-15 MED ADMIN — ONDANSETRON HCL 4 MG/2ML IJ SOLN: INTRAVENOUS | Stop: 2022-11-15 | NDC 60505613005

## 2022-11-15 MED ADMIN — HYDROMORPHONE HCL 1 MG/ML IJ SOLN: .2 mg | INTRAVENOUS | @ 01:00:00 | Stop: 2022-11-15 | NDC 00409128331

## 2022-11-15 NOTE — Consults
Speech Language Pathology  Communication/Cognition Assessment    PATIENT: [name]  MRN: [id number]  DOB: [birth year]    ADMIT DATE: 11/04/2022       Date of Evaluation: 11/15/2022  Evaluation Start Time: 0925    Problems: Principal Problem:    Brain mass (POA: Yes)       Past Medical History:   Diagnosis Date    Arthritis     Hypertension     Past Surgical History:   Procedure Laterality Date    FRACTURE SURGERY      JOINT REPLACEMENT            Assessment     Treatment Diagnosis: Aphasia  Prior Level of Function: At baseline the pt was fully independent and led a very active life-style including driving and shopping, as well as caring for her home.  Background Relevant to Treatment Diagnosis: 77 y.o. year old female who presented with a 2 week history of progressive speech difficulty, that she described as the words coming out wrong', and lethargy. Language problems were equally present when speaking in [place] (first language), [place], and [place].  The Patient is now s/p left temporal resection of glioblastoma on 4/3  and  s/p left craniotomy for hematoma evacuation on 11/14/2022.     Patient Presentation: Responsive;Alert  Comment: The pt was alert and sitting up in a chair.  Her daughter was present for the second half of the evaluation.    Financial risk analyst Used?: Yes  Method of Language Assistance Used: Journalist, newspaper Full Name (First, Last): Fatima  Interpreter ID Number or Badge Number: [id number]    Pain Assessment     Patient complains of pain: No    Oral Peripheral Exam (if applicable)          Speech (if applicable)     Findings: Within Functional Limits  Comment: Decrease in intelligibility was secondary to language deficits.    Language (if applicable)     Auditory Comprehension: Tested  Yes/No Questions: Tested  Biographic Yes/No Questions: Tested  # Asked: 8  # Correct: 7  Simple Non-Biographic Questions: Tested  # Asked: 6  # Correct: 4  Complex Questions: Tested  # Asked: 6  # Correct: 4  Comment: Testing was affected by the patients tendency to interrupt and respond to questions and instructions before they were finished.  Many questions were repeated.  Spoken Commands: Correct responses for 1-part spoken commands  Comment: 50% accurate following two step commands.  Short Paragraph Comprehension: Not Tested  Responses in Conversation: Not Tested    Reading Comprehension: Not Tested    Spoken Expression: Tested  Conversation: Reduced speech fluency;Reduced content;Obvious word finding problems  Comment: The pt often relies on gestures to communicate her ideas.  Cookie Theft Picture Description: Obvious word finding problems;Reduced speech fluency;Reduced content  Comment: The pt described the picture as follows: ''Cookie jar. water. going''  Confrontation Naming: Anomia  Comment: The pt could not name pictures of common objects independently.  She was able to gesture the use and inconsistently able to produce the word with a semantic cues.  Generative Naming: Not Tested  Repetition: Not Tested    Written Expression: Tested  Write Name: Reduced legibility (moderate)  Cookie Theft Picture Description: There are sufficient errors to render the pt's written expression ineffective;Reduced legibility (severe)  Comment: The pt wrote four words the only intelligible word was ''agua'' (water)    Pragmatic Language: Not Tested  Gestural Expression : As noted above the pt often relied on gestures to communicate, which was effective.    Cognition (if applicable)        Unable to formally assess due to the nature and severity of the pt's communication deficits.  Attention was adequate for the purpose of this evaluation          Other Observations (if applicable)      N/a    Impressions     Pt's Current Level of Communication Function:  The pt required assistance to communicate her wants and needs.  Expressive language was severely limited, however the pt was able to communicate some ideas with gestures. Responses to questions and simple commands were often impulsive, which was described as baseline behavior by her daughter.  The pt is able to follow simple commands.  All testing was conducted in [place], the assistance of a telephone interpreter.     Abnormal Findings/Test Results:  Auditory comprehension was accurate for one part commands and concrete yes/no questions.  The pt inconsistently followed two part commands and it was necessary to repeat instructions.  Verbal expression was affected by severe word finding difficulty.  The pt was not able to name common objects or describe actions in a picture.  Written expression was severely impaired as well.  The pt was able to write her name and a few words only one of which was recognizable.    Estimated Impact of Pt's Communication/Cognitive Deficits: This pt demonstrates severe language deficits that impact her ability to express basic wants and needs. The listener carries the burden of communication.  The pt is able to communicate some of her ideas by using gestures. The pt would have difficulty communicating with unfamiliar listeners.  She would not be able to return to her prior level of independence, at least in the immediate future.            Treatment Plan and Goals     Patient Goals: Patient wants to be able to communicate normally.  Treatment Recommended During This Hospital Stay: Yes  Rehabilitation Potential: Excellent  Treatment Plan: Language retraining  Frequency of Treatment: 1-3 days a week, 15-60 minutes per session  Progress Note Due Date: 11/22/22  Long-Term Goals: The pt will be able to express her needs in this setting using verbal and/or non-verbal strategies learned in speech therapy.  (LTG to be completed by the end of therapy or by discharge from acute hospital stay)         Auditory Comprehension Short Term Goals  Pt will comprehend single words: when following 2-part commands  Accuracy: 80%  Goal Status: Initial       Spoken Expressive Language Short Term Goals  Pt will utilize a communication board: in the environment  Accuracy: 80%  Goal Status: Initial    Pt will verbally complete common phrases: in structured therapist directed activities  Accuracy: 80%  Goal Status: Initial              Recommendations     Treatment Recommended After Discharge From Hospital: Yes    Other Recommendations: Pt. would benefit from intense speech therapy; Supervision at home    When communicating with the patient, hospital staff should keep content short and linguistically simple. Repeat simple commands and questions as needed. Allow additional time for the patient respond.     Education     Patient Education: Yes ? Topic: Evaluation results and Evaluation recommendations  ? Person Taught: patient and  family member      ? Barrier: None and Cognitive   ? Needs: New material    ? Teaching Method: Teacher, English as a foreign language and Verbal instruction  ? Outcome: Needs further instruction- described education plan      Evaluation Completed by: Jacquelin Hawking, SLP,  11/15/2022

## 2022-11-15 NOTE — Op Note
Operative Note    Patient: [name]  MRN: [id number]  Date of Birth: [birth year]    Date of Surgery: 11/14/22    Pre-operative Diagnosis: epidural hematoma  Post-operative Diagnosis: same  Title of Operation: L craniotomy for evacuation of epidurla hematoma    Surgeon(s) and Role:     Ellyn Hack., MD - Primary     * Theresia Majors., MD    Indications: Patient with recent surgery and new aphasia found to have epidural hematoma    Description of Operation    Setup: The patient was brought to the operating room. Patient was intubated    Anesthesia: General anesthesia    Pre-operative medications: ancef    Positioning: supine    Hair Preparation: no shave    Neuro-navigation registration: none    Sterile prep: betadine    Definitive procedure: The surgical site was prepped and draped using sterile technique. The incision was opened using sharp dissection. The scalp flap was lifted and retracted with fish hooks and a wet sponge. Central tack up sutures were opened. Screws were removed and the craniotomy was lifted. There was a large compressive epidural hematoma which was evacuated. The old patch was cut and removed. There was run down from the epidural hematoma in the resection cavity. This was washed out to decrease risk of vasospasm as MCA vessels were at bottom on the resection cavity. Hemostasis was acquired with bipolar cautery and surgicel. A new duramatrix suturable patch was sutured in place. Helistat was placed in the epidural space. A total of 10 central tack up sutures were placed and the bon flap was secured with screws. The incision was washed out with 3L of antibiotic irrigation. A 7 fr JP drain was placed in the epidural space. The galea was closed with 2-0 vicryl and skin with 3-0 monocryl. A head wrap was placed.     Attestation: I was present for the critical portion of the case and provided appropriate supervision for all aspects of this case.       Complications: None apparent    Wound Status: Clean    Specimens:   Frozen: none   Permanent/Research: none      Maximos Zayas S. Allena Katz  Assistant Professor  Department of Neurosurgery  [employer]  [place] [facility] [place]  [phone]

## 2022-11-15 NOTE — Progress Notes
NEUROSURGERY PROGRESS NOTE     DATE OF SERVICE:  11/15/2022    PATIENT: [name]        DOB: 03/19/1946    ATTENDING PHYSICIAN:  Ellyn Hack., MD    ADMISSION DATE:  11/04/2022    LENGTH OF STAY:  LOS: 11 days     CHIEF COMPLAINT: brain tumor     ACTIVE MEDICAL PROBLEMS:   Patient Active Problem List   Diagnosis    Brain mass       Present on Admission:   Brain mass      HPI:   [name] is a 77 y.o. year old female not on AC/AP who presents with 2 weeks of progressive speech difficulty that she describes as 'the words coming out wrong' as well as lethargy. Language problems are equally present when speaking in [place] (first language), [place]., and [place]. Denies any memory problems other than what she considers normal aging. Pt reports that she is otherwise in her usual state of health and denies any seizures, confusion, receptive language difficulty, sensory deficits, or gross motor deficits. Otherwise, she is very active and functional at baseline.    INTERVAL EVENTS:   3/30 Consulted IMCS for pre operative clearance. Pending OR for 11/09/22 with Dr. Allena Katz .   3/31: Na 132 (134). Discont 75cc NaCl. 0.2mg  bid Florinef. 1g salt tab bid today. Neurointact at bedside including language. Sitting bedside, eating, walking. Plan for OR 4/3. Cleared IMCS.  4/1: AFVSS. NAEON. Na 134 (135). Florinef, salt tabs, 3% 12.5cc/h. Dex/Keppra. OR Weds 4/3.  4/2: AFVSS. NAEON. Na 136 (135). Florinef, salt tabs, 3% 12.5cc/h. Dex/Keppra. OR Weds 4/3. Functional MRI scheduled for 6 am.  3%NaCL stopped  NPO P MN/IVF  4/4 POD1 s/p L temporal crani for tumor; MRI pending. NAEON, AF VSS. WBC 34.87 (37.52), Hb 10.5 (11). Na 139. Foley. PT/OT.  4/5 POD2. AF VSS. MRI complete pending read. WBC 29. Hb 9.8. Na 140. Keppra to followup. Decadron taper to 4 tid indef. Discont foley yest. Dispo ARU per PT/OT. SLP cog eval complete.  4/6 POD3. awaiting ARU   4/7: POD4. NAEON AF VSS. WBC 16.73 (19.37), Hb 10.2 (9.3). Na 139 (140). Dex 4 mg, gluc 116 +protonix. Keppra/Vimpat. Noted new left eye swelling, derm consult. Bmx2. PT/OT. SLP therapy for language. Pending ARU.   4/8 Pt experiencing global aphasia with NO extremity weakness.  CT perfusion brain ordered and will r/o any potential infections. Dexamethasone increased to 8mg  .  CT showed hematoma with mass effect on inferior frontal gyrus consistent with new worsening expressive aphasia. We will plan for OR for evacuation to restore language function and decrease mass effect   4/9 Continue to monitor JP drain    OBJECTIVE:     Vital Signs:   Temp:  [36 ?C (96.8 ?F)-36.8 ?C (98.2 ?F)] 36.7 ?C (98.1 ?F)  Heart Rate:  [51-76] 76  Resp:  [10-19] 19  BP: (119-176)/(58-90) 149/73  NBP Mean:  [75-115] 95  SpO2:  [92 %-98 %] 96 %  On RA    GENERAL: Comfortable, breathing comfortably, resting in bed in no apparent distress    NEURO EXAM:  Awake, alert and oriented x 3.  Able to Verbalize.  around 11:30. Naming 3/3   PERRL. Follows commands   English: Naming and repetition (able to participate)  Full strength  Incision open to air, c/d/I. Jp x 1 drain     Skin: new left eye swelling (photo in media tab)  numbness per patient   Chest: symmetrical chest rise, CTA in all lobes, NSR, (-)murmur  Vascular: 2+ pulses throughout (-)edema, cyanosis  ABD: soft, nontender, flat, normoactive bowel sounds   Voiding  DSG:  dsg to head intact     LINES AND DRAINS  CENTRAL LINE: None  DRAINS: x1 drain JP  FOLEY: None  SUTURE/STAPLE: 2 weeks post-op (4/17)    DATA   Pre-operative surgical visit and medical clearance visit documentation obtained and reviewed. Clinical lab tests reviewed daily. All associated imaging for this patient was independently reviewed and interpreted as listed below:    Post-op imaging:     11/11/22 MRI brain w/wo  Pending read    11/04/22 CT brain outside imaging   1. Large mixed density mass centered in the left anterior and mid temporal lobe with moderate perilesional vasogenic edema, regional mass effect and midline shift from left to right at the level of septum pellucidum of 6 mm. 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  2. There is no evidence of subfalcine or uncal herniation.  3. No hydrocephalus.    11/14/22 CT brain wo contrast   Redemonstrated postsurgical changes of left frontotemporal craniotomy and anterior temporal mass resection. Redemonstrated hematoma beneath the the craniotomy flap which measures up to 13 mm in thickness and contributes to 6 mm of rightward midline shift, along with fluid and blood products in the resection cavity.     CT perfusion: There is a region of prolonged Tmax in the region immediately surrounding the left anterior temporal surgical cavity which is slightly reduced in size following the administration of Diamox, indicating slightly improved transit. In the setting of evolving postoperative changes with fluid and hematomas in the region as above described, this is of unknown clinical significance. There is clinical concern for vasospasm, consider dedicated vascular imaging with CTA, MRA, or conventional diagnostic angiogram of the brain.       11/14/22 CT brain w WO contrast   Near complete evacuation of left epidural hematoma resulting in improvement in the mass effect and resolution of the midline shift.  No evidence of aneurysm, pseudoaneurysm or active bleeding.    ASSESSMENT AND PLAN:   7F admitted to inpatient pending OR, craniotomy for tumor resection.    NEURO  Brain tumor s/p L temporal crani, tumor rxn 4/3  Expressive aphasia   Hyponatremia   Hematoma with mass effect on inferior frontal gyrus s/p evacuation on 4/8   cont 0.2 Flurinef bid > stopped  salt tabs 1g bid 3/31 am > stopped  3% NaCl 12.5cc/h 4/1 > stopped 4/2  Na daily starting 4/5  SLP speech/cognitive eval complete  NA goal 135-145)  4/8 CT brain perfusion completed    4/9 Needs PT/OT   Continue to monitor JP drain     Vasogenic edema (POA)   AED: Keppra 1000mg  po bid to follow-up, vimpat 100 mg bid x7 d (4/10)  Steroid: 4/8 Dexamethasone increased to 8mg  Q8 PO taper to off   Suture/staple removal on POD#14 . 4/18      PAIN  Post operative pain   Tylenol and oxycodone PRN    RESPIRATORY  No active issues  Incentive spirometry    CARDIOVASCULAR  Hypertension  Bradycardia   Goal SBP 90-160  Keep K >4 and Mg >2  DC propranolol per IMCS    GU / RENAL  No active issues  Continue to monitor UOP    ID  Leukocytosis, afebrile 2/2 dexamethasone  Antibiotics: None   Continue to monitor WBC  F/U UA, procal, lactate      HEME / ONC  No active issues  Keep Hgb >7  Continue to monitor H&H    ENDOCRINE  Hyperglycemia  Glucose goal 100-140   Glycemic control with SSI    GI / HEPATIC  Constipation   Diet: Regular   Nausea management: zofran PRN  Bowel regimen  Ulcer prophylaxis    INTEGUMENTARY  Left eye swelling, improved   New on 4/7  Dermatology consult  Rec focal U.S.    PPx  DVT prophylaxis: SQH to restart on 4/10 @2100 ,  SCDs    Disposition  PT/OT: ARU  SLP cog:     The patient was examined and discussed with the resident Neurosurgery team & Attending Ellyn Hack., MD who agrees with assessment and plan listed above.    Author:    Lollie Marrow, NP   4:14 PM 11/15/2022  Department of Neurological Surgery

## 2022-11-15 NOTE — Consults
IP CM ACTIVE DISCHARGE PLANNING  Department of Care Coordination      Admit [phone]  Anticipated Date of Discharge: 11/16/2022    Following OY:WVXUC, Marquis Buggy., MD      Today's short update     Per IDR today, s/p craniotomy for hematoma evacuation 04/08, still has drain, pending removal, pending SLP eval, dispo ARU    Disposition     Acute Rehab Unit  TBD  Family/Support System in agreement with the current discharge plan: Yes, in agreement and participating    Facility Transfer/Placement Status (if applicable)     Facility accepted (7/7)    [facility]  [address]  [place], [place] [address]  Phone: [phone]  Fax: [phone]  Contact Person: Debby Bud / Ph: [phone]    Per Debby Bud: Pt appropriate for ARU. Pending medical stability, MD sign off, and bed availability.    Medical Transport Arrangement Status (if applicable)     Transportation needed (1/6)    Other Arrangements (if applicable)     Comments: Assigned CM will continue to re-assess safe discharge plan based on primary medical and interdisciplinary team recommendation(s).     Gillian Scarce RN, BSN  Covering Clinical Case Manager   Neurosurgery ~ [facility]  Tel: [phone]   ~   Pager: [phone]    Department of Care Coordination Main Office:   Phone: [phone]   ~ Fax: [phone]    For Assistance After Hours:   Weekdays Swing Shift : Pager [phone]  ED CM: [phone] ~ Pager [phone]   Weekends: [phone] ~ Pager [phone]

## 2022-11-15 NOTE — Progress Notes
11/15/22 0910   Time Calculation   Start Time 0910   OT Start of Care Date   (attempted 11/15/22)   Patient not seen due to Patient eating  (Will follow up after pt finishes eating.)

## 2022-11-15 NOTE — Progress Notes
NUTRITION IN-DEPTH SCREEN (Adult)    Admit Date: 11/04/2022     Date of Birth: 1946-02-16 Gender: female MRN: [id number]     Date of Screening 11/15/2022   Subjective: Nutri Follow-up: Chart reviewed, visited pt. Pt ate 100% of breakfast per meal tray observation. Assisted pt with picking up flowers that had dropped on floor. EVS called to sweep room. Pt mostly using hand gestures and nods. Per chart: no c/o n/v. LBM 4/7.    Problems: Principal Problem:    Brain mass (POA: Yes)       Past Medical History:   Diagnosis Date    Arthritis     Hypertension     Past Surgical History:   Procedure Laterality Date    FRACTURE SURGERY      JOINT REPLACEMENT           Anthropometrics     Height: 160 cm (5' 3'')  Admit Weight: 64.5 kg (142 lb 3.2 oz) (11/04/22 0300) Last 5 recorded weights:      11/04/2022     3:00 AM 11/06/2022     4:31 AM   Weights   Weight 64.5 kg 65.2 kg            IBW: 52.2 kg (115 lb)  % Ideal Body Weight: 125 %  BMI (Calculated): 25.46              Allergies   Patient has no known allergies.     Cultural / Religious / Ethnic Food Preferences   Unable to Assess       Nutrition Prior to Admission   UTA     Nutrition Risk Factors   Moderate Nutrition Risk Factors:  (Brain mass)  Acuity Level: 2-Moderate risk        Diet Orders     Diets/Supplements/Feeds   Diet    Diet regular     Start Date/Time: 11/15/22 0700      Number of Occurrences: Until Specified        Impression   PO % consumed: 76 to 100%  Impression: Diet tolerated well with good intake, Pt selecting balanced meals     Diet Education   No diet education needs at this time      FDI Target Drugs: No          Nutrition Care Plan   Plan: Continue with diet as ordered, Trend weights, Monitor adequacy of intake, Monitor tolerance to diet          Next Follow-up by 11/22/22    Author:  Osborne Casco, DTR, pager [phone]  11/15/2022 10:28 AM

## 2022-11-15 NOTE — Other
Patient's Clinical Goal:   Clinical Goal(s) for the Shift: vss, comfort, safety  Identify possible barriers to advancing the care plan: NA  Stability of the patient: Moderately Unstable - medium risk of patient condition declining or worsening    Progression of Patient's Clinical Goal: Patient is mostly non verbal, gestures and nods head appropriately. Does not c/o HA or N/V. Patient is ambulatory up to commode. Incision c/d/I and blister on L eye. JP drained 95cc. NS @ 75cc/hr.

## 2022-11-15 NOTE — Op Note
----------------------------------------------------------------------------------------------------------------------  (THE FOLLOWING IS FOR NURSING REFERENCE AND HAS BEEN PULLED FROM THE CURRENT CHART. PACU Phone: [phone])    Procedure(s) (LRB):  craniotomy for hematoma evacuation (Left)  Brain mass [G93.89]  Treatment Team         Provider   Role Specialty Contact     Neurosurgery  Team, 1st Provider Contact Neurological Surgery, Neurological Surgery  RR ICU/CONSULT:[id number],RR FLOOR:[id number],SMH:[id number]       Ellyn Hack., MD  Attending Neurological Surgery  [phone]   [phone]       Care, Neurosurgery - Neuro Critical  Team --  [phone]       Elly Modena, RN  Registered Nurse --  [phone]       Beulah Gandy [facility] --  [phone]       Gillian Scarce  Case Manager -- --     Dennard Schaumann, LCSW  Social Worker -- --     Dot Been., NP  Nurse Practitioner Neurological Surgery  [phone]   [phone]   Can call [phone]              __________________________________________________________________________________  Anesthesia Procedures    __________________________________________________________________________________    History  Past Medical History:   Diagnosis Date    Arthritis     Hypertension      Past Surgical History:   Procedure Laterality Date    FRACTURE SURGERY      JOINT REPLACEMENT       __________________________________________________________________________________    Labs  Recent Labs     11/14/22  1930 11/14/22  0801 11/14/22  0439 11/13/22  0742 11/13/22  0424   WBC  --   --  13.38*  --  16.73*   HCT  --   --  29.7*  --  31.1*   HGB  --   --  9.7*  --  10.2*   PLT  --   --  141*  --  158   NA  --   --  139  --  139   K  --   --  4.1  --  4.1   CL  --   --  103  --  106   CO2  --   --  27  --  27   BUN  --   --  14  --  17   CREAT  --   --  0.53*  --  0.56*   GLUCOSE  --   --  112*  --  116*   GLUCOSEPOC 150*   < >  --    < >  --     < > = values in this interval not displayed. __________________________________________________________________________________    Vital Signs  Last Recorded Vital Signs:    11/14/22 1930   BP: 136/62   Pulse: 51   Resp: 18   Temp:    SpO2: 98%     @LASTETCO2 @  Temp Readings from Last 1 Encounters:   11/14/22 36 ?C (96.8 ?F) (Temporal)       Pain Information (Last Filed)       Score Location Comments Edu?    0-No pain None None None          __________________________________________________________________________________    Intake and Output  I/O last 3 completed shifts:  In: 2720 [P.O.:1200; I.V.:1300; Other:120; IV Piggyback:100]  Out: 330 [Urine:200; Drains:80; Blood:50]  No intake/output data recorded.     __________________________________________________________________________________  IV Drips/Fluids/PCA:    ceFAZolin      papaverine       __________________________________________________________________________________    Lines, Drains, and Airways  Peripheral IV 22 G Anterior;Right Forearm (Active)       Peripheral IV 16 G Left Antecubital (Active)       Peripheral IV 18 G Right Antecubital (Active)       Surgical Drain 1 Left Head Flat (Active)       Wound 11/04/22 Ecchymosis Left;Posterior Forearm secondary to IV start from previous hosp (Active)       Wound 11/13/22 Blisters Left Cheek (Active)         Incision 11/09/22 Left Head (Active)     __________________________________________________________________________________    ----------------------------------------------------------------------------------------------------------------------      PACU NursingTransfer Note  7:44 PM, 11/14/2022      Isolation? No    Antibiotics in OR:  Last Antibiotic (last 24 hours) Showing orders from other encounters      Date/Time Action Medication Dose    11/14/22 1647 New Bag/ Syringe/ Cartridge    ceFAZolin inj 1,000 mg    11/14/22 1512 Given    ceFAZolin inj 2 g          Last Antiemetic:   Med Administrations and Associated Flowsheet Values (last 4 hours) Showing orders from other encounters      Date/Time Action Medication Dose    11/14/22 1702 Given    ondansetron 4 mg/2 mL inj 4 mg          Acetaminophen given @:  Med Administrations and Associated Flowsheet Values (last 24 hours)       Date/Time Action Medication Dose    11/14/22 1923 Given    acetaminophen IV inj 1,000 mg 1,000 mg    11/14/22 1154 Given    [MAR Hold] acetaminophen tab 650 mg (MAR Hold since Mon 11/14/2022 at 1440.Hold Reason: Unreviewed Transfer Orders) 650 mg          Last pain medication:   Pain Meds (last 4 hours) Showing orders from other encounters      Date/Time Action Medication Dose    11/14/22 1923 Given    acetaminophen IV inj 1,000 mg 1,000 mg    11/14/22 1837 Given    HYDROmorphone 1 mg/mL inj 0.2 mg 0.2 mg    11/14/22 1811 Given    HYDROmorphone 1 mg/mL inj 0.2 mg 0.2 mg    11/14/22 1710 Given    propofol 200 mg/20 mL inj 40 mg    11/14/22 1706 Given    propofol 200 mg/20 mL inj 50 mg    11/14/22 1653 Given    HYDROmorphone 2 mg/mL inj 0.6 mg    11/14/22 1635 Given    propofol 200 mg/20 mL inj 50 mg          Txp Anti-rejection medication:  Anti-rejection meds. (last 24 hours) Showing orders from other encounters      Date/Time Action Medication Dose    11/14/22 1529 Given    dexAMETHasone 4 mg/mL inj 4 mg    11/13/22 2349 New Bag/ Syringe/ Cartridge    dexAMETHasone 4 mg in sodium chloride 0.9% 50 mL IVPB 4 mg            Does the patient use prescription pain medication at home (prior to admission)?Marland KitchenMarland KitchenMarland KitchenUnknown    Pain level: Acceptable for patient? Yes    Difficult Airway? No    Respiratory is at baseline? No: 2 liters nasal cannula now    Has  the patient received Flumazenil or Narcan in PACU? No  (if ''Yes'', must be at least 45 minutes prior to transfer)     Aldrete: 9    Level of Consciousness:   Sleepy, arousal to calling her name, follows commands, MAE    Cardiac Rhythm?  SB    Surgical Site: Intact and Dry or with Minimal Drainage  Lines, Drains, and Airways       Wound Duration             Wound 11/04/22 Ecchymosis Left;Posterior Forearm secondary to IV start from previous hosp 10 days      Incision 11/09/22 Left Head 5 days    Wound 11/13/22 Blisters Left Cheek 1 day                   Diet: NPO    Tolerating liquids: Undetermined    Activity: MAE: Has not ambulated    Voided:  Due to Void    Significant Other Location:  At Bedside    Will Transport to: Floor                       With: RN & Monitor    Continuity of Care Issues from OR or PACU:   CT pending

## 2022-11-15 NOTE — Consults
Physical Therapy Re-Evaluation      PATIENT: [name]  MRN: [id number]  DOB: 1945-08-18    ADMIT DATE: 11/04/2022     Date of Evaluation: 11/15/2022    Problems: Principal Problem:    Brain mass (POA: Yes)       Past Medical History:   Diagnosis Date    Arthritis     Hypertension     Past Surgical History:   Procedure Laterality Date    FRACTURE SURGERY      JOINT REPLACEMENT          Relevant Hospital Course:  77 y.o. F presenting to an OSH w/ word finding difficulty, mild RUE weakness, & lethargy. 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 Transferred to [facility] for HLOC on 11/04/22. Pt s/p L frontal craniotomy for resection of brain tumor on 11/09/22. Initial PT eval completed on 11/10/22. On 11/14/22, pt with noticeable decrease in language function this morning. No worsening weakness or pronator drift. CT brain 11/14/22 shows hematoma with mass effect on inferior frontal gyrus consistent with new worsening expressive aphasia. Pt now s/p craniotomy for hematoma evacuation on 11/14/22.    Patient Stated Goal: To go to the bathroom. Per daughter, goal is for pt to return to ambulating without AD.     Living Arrangements   Type of Home: House  Home Layout: One level  Bathroom Shower/Tub: Tub/shower unit, Walk-in shower (but uses the tub/shower @ baseline)  Bathroom Equipment: Grab bars in shower, Hand-held shower  Home Equipment: Cane  Additional Comments: info obtain from pt's daughter at bedside. per daughter, pt would occasionally use SPC when pt back pain worsens    Prior Level of Function   Level of Independence: Independent, Community ambulation  Lives With: Spouse  Support Available: Family  # of hours available: 24hr/day supervision & limited physical assistance from husband. Daughter stating that pt's husband is unable to drive & that his English is limited.  ADL Assistance: Independent, Activities of Daily Living  Homemaking Assistance: Independent  Vision: Glasses  Hearing: Within Functional Limits  Transportation: Drives Self    Precautions   Precautions: Fall risk;Craniotomy;Monitor Vitals;Check Labs (SBP 90-160)  Orthotic: None  Current Activity Order: Ambulate  Weight Bearing Status: Not Applicable    GENERAL EVALUATION   Position: In bed;Bed alarm on;SCD's;Family/CG present  Lines/devices Drains: IV/PICC;Cardiac Monitor;Pulse Ox;JP Drain/s     Bed Mobility   Supine to Sit: Contact Guard Assist;Verbal Cueing;to Left    Functional Mobility   Sit to Stand: Minimum Assist;Verbal Cueing;Assistive Device (Comment) (from EOB 1x; from toilet 1x; FWW)  Ambulation: Minimum Assist;Verbal Cueing  Ambulation Distance (Feet): 57ft x 3  Gait Pattern: Decreased weight shift;Decreased stride length;Decreased pace;Narrow Base of Support;Decreased heel-toe (VC to increase trunk/hip extension)  Assistive Device: Front wheeled walker     Balance   Sitting - Static: with UE support (fair+)  Sitting - Dynamic: Able to weight shift anteriorly;Able to weight shift posteriorly;Able to weight shift laterally;Able to weight shift alternating sides;with UE support  Standing - Static: Requires MIN A to maintain;with UE support  Standing - Dynamic: Requires MIN A to recover;with UE support    UE Assessment   R UE Assessment: Within functional limits  L UE Assessment: Within functional limits     LE Assessment   RLE Assessment: Gross Assessment  grossly 4/5 throughout      LLE Assessment: Gross Assessment  grossly 4/5 throughout      Sensation  Sensation: Grossly intact    Cognition   Cognition: Exceptions to WDL  Arousal/Alertness: Delayed responses to stimuli  Attention Span: Attends with cues to redirect;Difficulty dividing attention  Memory: Decreased recall of precautions  Orientation Level: Oriented X4  Following Commands: Follows one step commands with increased time;Follows one step commands with repetition  Safety Awareness: Poor awareness of safety precautions;Decreased awareness of need for assistance  Barriers to Learning: Cognitive Limitations;Altered Mental Status;Language (pt able to communicate in basic [place]; daughter also assisting with Portugese interpretation per pt/family preference)    Neurological Evaluation (if indicated)   Neuro Deficits: Yes  Inattention/Neglect: Appears Intact  Midline Orientation: Grossly Intact  Motor Planning: Cues to initiate activity;Assist to initiate activity  Motor Control: Grossly intact all extremities  Coordination: Decreased speed;Decreased accuracy  Perseveration: Not present  RUE Tone: Within Functional Limits  LUE Tone: Within Functional Limits  RLE Tone: Within Functional Limits  LLE Tone: Within Functional Limits  Head Control and Alignment: Within Functional Limits  Trunk Control: Within Functional Limits    Neuro Re-education Activities: Tolerance to upright position    Pain Assessment   Patient complains of pain: No    Exercises  Other Exercise(s): sit<>stand x2    Patient Status   Activity Tolerance: Good  Oxygen Needs: Room Air  Response to Treatment: Tolerated treatment well;Vital signs stable;Nursing notified  Compliance with Precautions: Good  Call light in reach: Yes  Presentation post treatment: Up in chair;On cardiac monitor;Lines/drains intact;Pulse Ox;Body holder on;Chair alarm on;Family/CG present  Comments: Pt minimal conversive however responds back appropriately with 1 word answers when prompted. Pt less impulsive than prior sessions, however continues to be distracted by lines and benefits from VC to slow down for line management.    Interdisciplinary Communication   Interdisciplinary Communication: Nurse;Occupational Therapist    ASSESSMENT   Rehab Potential: Good  Inpatient Recommendation: PT treatment  Problem List: Decreased bed mobility;Decreased transfers;Decreased gait;Stairs;Decreased strength;Decreased range of motion;Decreased activity tolerance;Fall risk;Impaired balance;Decreased knowledge of precautions;Decreased knowledge of exercise program;Need for caregiver/family education;Discharge needs  Treatment Plan: Bed mobility training;Transfer training;Gait training;Stair training;Therapeutic exercise;Balance training;Patient and/or family education;Caregiver education;Discharge planning;Education on precautions;Home program;Neuromuscular re-education  Frequency: 3-5 x/week  Duration (days): 30  Progress Note Due Date: 11/22/22    Goals Discussed With: Patient;Family    Short Term Goals to be achieved in: 7 days  Pt will perform supine to sit: with stand by assist  Pt will perform sit to stand: with contact guard assist;with cane  Pt will ambulate: 31-50 feet;with cane;with contact guard assist    Long Term Goals to be achieved in: 30 days  Pt will be: safe and independent with functional mobility with appropriate assistive device  Pt will perform all functional mobility: while adhering to precautions    PT Recommendations   Discharge Recommendation: Physical Therapy;Would benefit from intensive therapy upon discharge  Discharge concerns: Requires assistance for mobility;Requires assistance for self care  Discharge Equipment Recommended: Defer to discharge facility;If patient dc home instead of rehab as recommended;Walker    AM-PAC   AM-PAC Basic Mobility t-Scale Score: 41.05  AM-PAC Basic Mobility CMS 'G Code' Modifier: CK  AM-PAC Basic Mobility Raw Score: 18    Evaluation Completed by: Aldean Ast, PT,  11/15/2022

## 2022-11-15 NOTE — Progress Notes
11/15/22 0955   Time Calculation   Start Time 0955   Patient not seen due to Patient not available;Patient with another discipline     New PT order received as pt s/p OR. Attempted PT re-eval, however pt working with SLP.   PT will continue to follow.    Lacey Jensen, PT  Ph: [phone]

## 2022-11-15 NOTE — Brief Op Note
Brief Operative/Procedure Note    Patient: [name]    Date of Operation(s)/Procedure(s): 11/14/2022    Pre-op Diagnosis: epidural collection       Post-op Diagnosis: same    Operation(s)/Procedure(s):  craniotomy for hematoma evacuation    Surgeon(s) and Role:     Ellyn Hack., MD - Primary     * Theresia Majors., MD    Anesthesia Staff and Role:  CRNA: Waynetta Pean., CRNA  Anesthesia Resident: Maebelle Munroe., MD  Anesthesiologist: Ellene Route, MD    Anesthesia Type:   General    Pre-Op Medications: ancef    Intra-op Medications: (Antibiotics, Anticoagulants, Immunosuppressants)  Medication Name Total Dose   THROMBIN 5,000 UNITS WITH GELFOAM SPONGE 100 1 each   THROMBIN 5,000 UNITS, GELFOAM 1G POWDER WITH 0.9% NACL 1 each   papaverine 30 mg/mL inj 60 mg   ceFAZolin inj 1,000 mg   acetaminophen tab 650 mg 2,600 mg   bisacodyl supp 10 mg Cannot be calculated   calcium carbonate chew tab 1,000 mg 12,000 mg   docusate cap 100 mg 900 mg   heparin 5000 unit/mL inj 5,000 Units 40,000 Units   hydrOXYzine tab 25 mg 25 mg   lacosamide tab 100 mg 700 mg   magnesium hydroxide 400 mg/5 mL susp 30 mL Cannot be calculated   melatonin tab 5 mg 10 mg   ondansetron 4 mg/2 mL inj 4 mg 4 mg   ondansetron tab 4 mg Cannot be calculated   pantoprazole DR tab 40 mg 280 mg   psyllium pwd packet 1 packet 7 packet   sodium phosphate ([place]) ADULT enema 1 enema Cannot be calculated       Blood Products: none    Fluids: none    Estimated Blood Loss: less than 100 mL    Findings: epidural hematoma with minimal intradural extension. Like STA found as source    Complications: None; patient tolerated the operation(s)/procedure(s) well.                 Specimens:   ID Type Source Tests Collected by Time   1 : 8 explanted screws Foreign Body/Substance Head TISSUE EXAM/FOREIGN BODY (AP) Ellyn Hack., MD 11/14/2022 1628       Drains:   Surgical Drain 1 Left Head Flat (Active)            Staff and Role:   Circulating Nurse: Ralene Cork, RN; Lebron Conners, RN; Kirk Ruths, RN  Scrub Person: Ailene Rud, RN; Allena Earing M. Sherryll Burger, MD    Date: 11/14/2022  Time: 5:36 PM

## 2022-11-15 NOTE — Progress Notes
Patient requires repeat CTA to rule out new / ongoing vasospasm as cause of her symptoms and runs the risk of suffering permanent deficit if this is delayed. Therefore the benefits of repeat contrast dosing outweigh the risks.

## 2022-11-16 ENCOUNTER — Ambulatory Visit: Payer: Commercial Managed Care - Pharmacy Benefit Manager

## 2022-11-16 LAB — Glucose,POC
GLUCOSE,POC: 112 mg/dL — ABNORMAL HIGH (ref 65–99)
GLUCOSE,POC: 166 mg/dL — ABNORMAL HIGH (ref 65–99)
GLUCOSE,POC: 94 mg/dL (ref 65–99)

## 2022-11-16 LAB — Basic Metabolic Panel
CHLORIDE: 107 mmol/L — ABNORMAL HIGH (ref 96–106)
SODIUM: 136 mmol/L (ref 135–146)

## 2022-11-16 LAB — CBC: MCH CONCENTRATION: 33.7 g/dL (ref 31.5–35.5)

## 2022-11-16 MED ADMIN — MELATONIN 5 MG PO TABS: 5 mg | ORAL | @ 04:00:00 | Stop: 2022-11-19

## 2022-11-16 MED ADMIN — ACETAMINOPHEN 325 MG PO TABS: 650 mg | ORAL | @ 18:00:00 | Stop: 2022-11-18 | NDC 71399801401

## 2022-11-16 MED ADMIN — LACOSAMIDE 100 MG PO TABS: 100 mg | ORAL | @ 04:00:00 | Stop: 2022-11-17 | NDC 60687068711

## 2022-11-16 MED ADMIN — INSULIN ASPART 100 UNIT/ML SC SOPN: 3 mL | SUBCUTANEOUS | @ 01:00:00 | Stop: 2022-12-10 | NDC 00169633910

## 2022-11-16 MED ADMIN — PSYLLIUM PO PACK (MULTI-GPI): 1 | ORAL | @ 16:00:00 | Stop: 2022-11-17

## 2022-11-16 MED ADMIN — LACOSAMIDE 100 MG PO TABS: 100 mg | ORAL | @ 16:00:00 | Stop: 2022-11-17 | NDC 60687068711

## 2022-11-16 MED ADMIN — ACETAMINOPHEN 325 MG PO TABS: 650 mg | ORAL | @ 07:00:00 | Stop: 2022-11-18 | NDC 71399801401

## 2022-11-16 MED ADMIN — CALCIUM CARBONATE ANTACID 500 (200 CA) MG PO CHEW: 1000 mg | ORAL | @ 16:00:00 | Stop: 2022-12-08 | NDC 68084098833

## 2022-11-16 MED ADMIN — DEXAMETHASONE 6 MG PO TABS: 6 mg | ORAL | @ 16:00:00 | Stop: 2022-11-18 | NDC 60687072911

## 2022-11-16 MED ADMIN — DOCUSATE SODIUM 100 MG PO CAPS: 100 mg | ORAL | @ 16:00:00 | Stop: 2022-12-10 | NDC 00904718361

## 2022-11-16 MED ADMIN — DEXAMETHASONE 4 MG PO TABS: 8 mg | ORAL | @ 07:00:00 | Stop: 2022-11-16 | NDC 66993073051

## 2022-11-16 MED ADMIN — ACETAMINOPHEN 325 MG PO TABS: 650 mg | ORAL | @ 05:00:00 | Stop: 2022-11-18

## 2022-11-16 MED ADMIN — CEFAZOLIN SODIUM-DEXTROSE 1-4 GM/50ML-% IV SOLN: 1 g | INTRAVENOUS | @ 18:00:00 | Stop: 2022-11-17 | NDC 00338350341

## 2022-11-16 MED ADMIN — CALCIUM CARBONATE ANTACID 500 (200 CA) MG PO CHEW: 1000 mg | ORAL | @ 04:00:00 | Stop: 2022-12-08 | NDC 68084098833

## 2022-11-16 MED ADMIN — DOCUSATE SODIUM 100 MG PO CAPS: 100 mg | ORAL | @ 04:00:00 | Stop: 2022-11-19 | NDC 00904718361

## 2022-11-16 MED ADMIN — PSYLLIUM PO PACK (MULTI-GPI): 1 | ORAL | @ 04:00:00 | Stop: 2022-11-17

## 2022-11-16 MED ADMIN — DEXAMETHASONE 6 MG PO TABS: 6 mg | ORAL | @ 23:00:00 | Stop: 2022-11-18 | NDC 60687072911

## 2022-11-16 MED ADMIN — ACETAMINOPHEN 325 MG PO TABS: 650 mg | ORAL | @ 13:00:00 | Stop: 2022-11-18 | NDC 71399801401

## 2022-11-16 MED ADMIN — OXYCODONE HCL 5 MG PO TABS: 5 mg | ORAL | @ 13:00:00 | Stop: 2022-11-17 | NDC 68084035411

## 2022-11-16 MED ADMIN — PANTOPRAZOLE SODIUM 40 MG PO TBEC: 40 mg | ORAL | @ 16:00:00 | Stop: 2022-12-08 | NDC 50268063911

## 2022-11-16 NOTE — Progress Notes
Brain Tumor Board Summary Note    The Brain Tumor Board comprising of Neuro-Oncologists, Radiation Oncologists, Neurosurgeons, Neuropathologists, and Neuroradiologists met on 11/16/2022 for review of pathology s/p left temporal tumor resection on 11/09/2022. Review of pre op imaging demonstrates a left anterior temporal lobe enhancing lesion with central cystic areas and mass effect. Post-op interval resection with some mild enhancement along resection margin. Pathology consistent with glioblastoma WHO grade 4. Pending EGFR and PTEN and MGMT methylation testing. Recommend SOC. F/u with Neuro-Onc, Rad onc, and Nsgy     Patient will follow-up with  [X]  Neurosurgery  [X]  Neuro-Oncology  [X]  Radiation Oncology  [ ]  Local physicians    This note is written by Baltazar Apo as a scribe for the [facility] Brain Tumor Board.

## 2022-11-16 NOTE — Progress Notes
Speech Language Pathology  Communication/Cognition Treatment Note    PATIENT: [name]  MRN: [id number]  DOB: [birth year]    Date of Treatment: 11/16/2022  Time: 1525  Length of Treatment: 30 minutes    Pain Assessment     Patient complains of pain: No    Treatment Provided      Results: The pt was seen sitting up in a chair with her daughter also present in the room.  The pt was very alert and conversant in [place].  The pt and her daughter stated that verbal expression skills were ''90%'' back to the pt's baseline in her first language, [place].  English is not yet at her baseline.  The pt was able to name common objects in the environment in her first language.  Demonstrated the Vida[employer] app on the iPad in the room to work on phrases in both [place] and [place]          Auditory Comprehension Short Term Goals  Pt will comprehend single words: when following 2-part commands  Accuracy: 80%  Goal Status: Initial  Pt will comprehend single sentences: when answering personally related yes/no questions  Accuracy: 80%  Goal Status: Initial          Spoken Expressive Language Short Term Goals  Pt will utilize a communication board: in the environment  Accuracy: 80%  Goal Status: Initial  Pt will verbally complete common phrases: in structured therapist directed activities  Accuracy: 80%  Goal Status: Initial                 Education     Patient Education: Discussed progress in speech skills with the pt and her daughter.       Jacquelin Hawking, SLP

## 2022-11-16 NOTE — Other
Patient's Clinical Goal:   Clinical Goal(s) for the Shift: Patient safety and comfort  Identify possible barriers to advancing the care plan:   Stability of the patient: Moderately Unstable - medium risk of patient condition declining or worsening    Progression of Patient's Clinical Goal: Patient Aox2-3, VSS, denies any pain. Pending JP Drain removal prior to transfer to ARU.

## 2022-11-16 NOTE — Consults
IP CM ACTIVE DISCHARGE PLANNING  Department of Care Coordination      Admit [id number]  Anticipated Date of Discharge: 11/18/2022    Following IO:NGEXB, Marquis Buggy., MD      Today's short update     Per chart review and IDR today, S/p craniotomy for hematoma evacuation 04/10, JP drain need to be removed prior to DC, dispo ARU    Patient's daughter expressed that patient is eager to leave the hospital. CM explained barriers for discharge and provided updates for dispo plan. Patient's daughter verbalized understanding.     Disposition     Acute Rehab Unit  [facility] [address]  [place], [place] [address]  Family/Support System in agreement with the current discharge plan: Yes, in agreement and participating    Facility Transfer/Placement Status (if applicable)     Facility accepted (7/7), Authorization in progress (5/7)    [facility]  [address]  [place], [place] [address]  Phone: [phone]  Fax: [phone]  Contact Person: Debby Bud / Ph: [phone]    CM sent most recent PT/OT notes S/p craniotomy on 04/08. Pending bed availability, he will see the patient tomorrow at bedside.     4:11 pm: Per Debby Bud, patient needs authorization for ARU.     CM called insurance to request authorization, went straight to voicemail, CM left a detailed VM. CM efaxed referral to [facility] (Ph: [phone] / F: [phone]) via Communications Tab, requesting authorization for ARU.      Medical Transport Arrangement Status (if applicable)     Transportation needed (1/6)    Other Arrangements (if applicable)        Agency: [employer]     Phone Number: [phone]        Comments: Assigned CM will continue to re-assess safe discharge plan based on primary medical and interdisciplinary team recommendation(s).     Gillian Scarce RN, BSN  Covering Clinical Case Manager   Neurosurgery ~ [facility]  Tel: [phone]   ~ Pager: [phone]    Department of Care Coordination Main Office:   Phone: [phone]   ~ Fax: [phone]    For Assistance After Hours:   Weekdays Swing Shift : Pager [phone]  ED CM: [phone] ~ Pager [phone]   Weekends: [phone] ~ Pager [phone]

## 2022-11-16 NOTE — Other
Patient's Clinical Goal:   Clinical Goal(s) for the Shift: Patient safety and comfort  Identify possible barriers to advancing the care plan: NA  Stability of the patient: Moderately Unstable - medium risk of patient condition declining or worsening    Progression of Patient's Clinical Goal: AOx 4, able to verbalize needs. Patient is ambulatory up to commode. Incision c/d/I and blister on L eye. JP drained 120cc in last 24 hrs. PRN oxy given x1 with relief.     POC as follows:  - JP drain to gravity  -possible ARU when medically stable.

## 2022-11-16 NOTE — Progress Notes
NEUROSURGERY PROGRESS NOTE     DATE OF SERVICE:  11/16/2022    PATIENT: [name]        DOB: 03-Dec-1945    ATTENDING PHYSICIAN:  Ellyn Hack., MD    ADMISSION DATE:  11/04/2022    LENGTH OF STAY:  LOS: 12 days     CHIEF COMPLAINT: brain tumor     ACTIVE MEDICAL PROBLEMS:   Patient Active Problem List   Diagnosis    Brain mass       Present on Admission:   Brain mass      HPI:   [name] is a 77 y.o. year old female not on AC/AP who presents with 2 weeks of progressive speech difficulty that she describes as 'the words coming out wrong' as well as lethargy. Language problems are equally present when speaking in [place] (first language), [place]., and [place]. Denies any memory problems other than what she considers normal aging. Pt reports that she is otherwise in her usual state of health and denies any seizures, confusion, receptive language difficulty, sensory deficits, or gross motor deficits. Otherwise, she is very active and functional at baseline.    INTERVAL EVENTS:   3/30 Consulted IMCS for pre operative clearance. Pending OR for 11/09/22 with Dr. Allena Katz .   3/31: Na 132 (134). Discont 75cc NaCl. 0.2mg  bid Florinef. 1g salt tab bid today. Neurointact at bedside including language. Sitting bedside, eating, walking. Plan for OR 4/3. Cleared IMCS.  4/1: AFVSS. NAEON. Na 134 (135). Florinef, salt tabs, 3% 12.5cc/h. Dex/Keppra. OR Weds 4/3.  4/2: AFVSS. NAEON. Na 136 (135). Florinef, salt tabs, 3% 12.5cc/h. Dex/Keppra. OR Weds 4/3. Functional MRI scheduled for 6 am.  3%NaCL stopped  NPO P MN/IVF  4/4 POD1 s/p L temporal crani for tumor; MRI pending. NAEON, AF VSS. WBC 34.87 (37.52), Hb 10.5 (11). Na 139. Foley. PT/OT.  4/5 POD2. AF VSS. MRI complete pending read. WBC 29. Hb 9.8. Na 140. Keppra to followup. Decadron taper to 4 tid indef. Discont foley yest. Dispo ARU per PT/OT. SLP cog eval complete.  4/6 POD3. awaiting ARU   4/7: POD4. NAEON AF VSS. WBC 16.73 (19.37), Hb 10.2 (9.3). Na 139 (140). Dex 4 mg, gluc 116 +protonix. Keppra/Vimpat. Noted new left eye swelling, derm consult. Bmx2. PT/OT. SLP therapy for language. Pending ARU.   4/8 Pt experiencing global aphasia with NO extremity weakness.  CT perfusion brain ordered and will r/o any potential infections. Dexamethasone increased to 8mg  .  CT showed hematoma with mass effect on inferior frontal gyrus consistent with new worsening expressive aphasia. We will plan for OR for evacuation to restore language function and decrease mass effect   4/9 Continue to monitor JP drain  4/10 Continue to monitor JP drain. JP drain changed to gravity. Pending PT/OT eval today. No abx per Dr. Allena Katz.     OBJECTIVE:     Vital Signs:   Temp:  [36.5 ?C (97.7 ?F)-37 ?C (98.6 ?F)] 36.6 ?C (97.9 ?F)  Heart Rate:  [56-76] 64  Resp:  [17-24] 19  BP: (119-159)/(58-82) 134/69  NBP Mean:  [75-102] 88  SpO2:  [96 %-98 %] 98 %  On RA    GENERAL: Comfortable, breathing comfortably, resting in bed in no apparent distress    NEURO EXAM:  Awake, alert and oriented x 3.  Able to Verbalize.  around 11:30. Naming 3/3   PERRL. Follows commands   English: Naming and repetition (able to participate)  Full strength  Incision open to air, c/d/I. Jp x 1 drain     Skin: new left eye swelling (photo in media tab) numbness per patient   Chest: symmetrical chest rise, CTA in all lobes, NSR, (-)murmur  Vascular: 2+ pulses throughout (-)edema, cyanosis  ABD: soft, nontender, flat, normoactive bowel sounds   Voiding  DSG:  dsg to head intact     LINES AND DRAINS  CENTRAL LINE: None  DRAINS: x1 drain JP  FOLEY: None  SUTURE/STAPLE: 2 weeks post-op (4/17)    DATA   Pre-operative surgical visit and medical clearance visit documentation obtained and reviewed. Clinical lab tests reviewed daily. All associated imaging for this patient was independently reviewed and interpreted as listed below:    Post-op imaging:     11/11/22 MRI brain w/wo  Pending read    11/04/22 CT brain outside imaging   1. Large mixed density mass centered in the left anterior and mid temporal lobe with moderate perilesional vasogenic edema, regional mass effect and midline shift from left to right at the level of septum pellucidum of 6 mm. 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  2. There is no evidence of subfalcine or uncal herniation.  3. No hydrocephalus.    11/14/22 CT brain wo contrast   Redemonstrated postsurgical changes of left frontotemporal craniotomy and anterior temporal mass resection. Redemonstrated hematoma beneath the the craniotomy flap which measures up to 13 mm in thickness and contributes to 6 mm of rightward midline shift, along with fluid and blood products in the resection cavity.     CT perfusion: There is a region of prolonged Tmax in the region immediately surrounding the left anterior temporal surgical cavity which is slightly reduced in size following the administration of Diamox, indicating slightly improved transit. In the setting of evolving postoperative changes with fluid and hematomas in the region as above described, this is of unknown clinical significance. There is clinical concern for vasospasm, consider dedicated vascular imaging with CTA, MRA, or conventional diagnostic angiogram of the brain.       11/14/22 CT brain w WO contrast   Near complete evacuation of left epidural hematoma resulting in improvement in the mass effect and resolution of the midline shift.  No evidence of aneurysm, pseudoaneurysm or active bleeding.    ASSESSMENT AND PLAN:   60F admitted to inpatient pending OR, craniotomy for tumor resection.    NEURO  Brain tumor s/p L temporal crani, tumor rxn 4/3  Expressive aphasia   Hyponatremia   Hematoma with mass effect on inferior frontal gyrus s/p evacuation on 4/8   4/8 CT brain perfusion completed    4/9 Needs PT/OT   Continue to monitor JP drain . JP to gravity   SLP speech/cognitive eval complete  NA goal (135-145)    Vasogenic edema (POA)   AED: Keppra 1000mg  po bid to follow-up, vimpat 100 mg bid x7 d (4/10)  Steroid: 4/8 Dexamethasone increased to 8mg  Q8 PO taper to off   Suture/staple removal on POD#14 . 4/18    PAIN  Post operative pain   Tylenol and oxycodone PRN    RESPIRATORY  No active issues  Incentive spirometry    CARDIOVASCULAR  Hypertension  Bradycardia . Stable  Goal SBP 90-160  Keep K >4 and Mg >2  DC propranolol per IMCS    GU / RENAL  No active issues  Continue to monitor UOP    ID  Leukocytosis, afebrile 2/2 dexamethasone  Antibiotics: None  Continue to monitor WBC  F/U UA(NEG), procal(NEG), lactate      HEME / ONC  No active issues  Keep Hgb >7  Continue to monitor H&H    ENDOCRINE  Hyperglycemia  Glucose goal 100-140   Glycemic control with SSI    GI / HEPATIC  Constipation   Diet: Regular   Nausea management: zofran PRN  Bowel regimen  Ulcer prophylaxis    INTEGUMENTARY  Left eye swelling, improved   New on 4/7  Dermatology consult  Rec focal U.S.    PPx  DVT prophylaxis: SQH to restart on 4/10 @2100 ,  SCDs    Disposition  PT/OT: ARU  SLP cog:     The patient was examined and discussed with the resident Neurosurgery team & Attending Ellyn Hack., MD who agrees with assessment and plan listed above.    Author:    Lollie Marrow, NP   8:54 AM 11/16/2022  Department of Neurological Surgery

## 2022-11-16 NOTE — Consults
Occupational Therapy Re-Evaluation      PATIENT: [name]  MRN: [id number]  DOB: 11-20-1945    ADMIT DATE: 11/04/2022       Date of Evaluation: 11/16/2022    Problems: Principal Problem:    Brain mass (POA: Yes)       Past Medical History:   Diagnosis Date    Arthritis     Hypertension     Past Surgical History:   Procedure Laterality Date    FRACTURE SURGERY      JOINT REPLACEMENT          Relevant Hospital Course: 77 y.o. F presenting to an OSH w/ word finding difficulty, mild RUE weakness, & lethargy. 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 Transferred to [facility] for HLOC on 11/04/22. Pt s/p L frontal craniotomy for resection of brain tumor on 11/09/22. Initial OT eval completed on 11/10/22. On 11/14/22, pt with noticeable decrease in language function this morning. No worsening weakness or pronator drift. CT brain 11/14/22 shows hematoma with mass effect on inferior frontal gyrus consistent with new worsening expressive aphasia. Pt now s/p craniotomy for hematoma evacuation on 11/14/22.    Patient Stated Goal:  ''Go home''     Living Arrangements   Type of Home: House  Home Layout: One level  Bathroom Shower/Tub: Tub/shower unit, Psychologist, counselling (uses tub/shower)  Bathroom Toilet: Midwife: Grab bars in shower, Hand-held shower  Home Equipment: Chiropractor Comments: info obtain from pt's daughter at bedside. per daughter, pt would occasionally use SPC when pt back pain worsens    Prior Level of Function   Level of Independence: Independent, Community ambulation  Lives With: Spouse  Support Available: Family  # of hours available: 24hr/day supervision & limited physical assistance from husband. Daughter stating that pt's husband is unable to drive & that his English is limited.  ADL Assistance: Independent, Activities of Daily Living  Homemaking Assistance: Independent  Vision: Glasses  Hearing: Within Functional Limits  Transportation: Drives Self    Precautions   Precautions: Fall risk;Craniotomy;Monitor Vitals;Check Labs (SBP 90-160)  Orthotic: None  Current Activity Order: Ambulate  Weight Bearing Status: Not Applicable    GENERAL EVALUATION   Position: Up in chair;Chair alarm on  Lines/devices Drains: HLIV;Pulse Ox;JP Drain/s (JP x1)    Functional Transfers   Sit to Stand: Stand by Assist;Assistive Device (Comment);Verbal Cueing  Transfer: Toilet;Tub  Toilet Transfers: Risk analyst  Tub Transfers: Contact Guard Assist;Verbal Cueing (simulated)  Functional Mobility: Stand by Assist;Front wheeled walker    Activities of Daily Living (ADLs)   UB Dressing: Performed (robe)  UB Dressing Assistance: Stand by Assist  UB Dressing Deficit: Supervision/safety  UB Dressing Adaptive Equipment: None  UB Dressing Where Assessed: Standing  LB Dressing: Performed  LB Dressing Assistance: Supervised (socks)  LB Dressing Deficit: Supervision/safety  LB Dressing Where Assessed: Edge of bed    Balance   Sitting - Static: Normal  Sitting - Dynamic: Good     RUE Assessment   RUE Assessment: Within Functional Limits (4/5 throughout)    LUE Assessment   LUE Assessment:  (4/5 throughout)    Hand Function   Gross Grasp: Functional;Right;Left  Coordination: Functional;Right;Left    Edema   Edema: none in BUEs    Sensation   Sensation: Grossly intact    Cognition   Cognition: Exceptions to WDL or Baseline Status  Arousal/Alertness: Appropriate responses to stimuli  Attention Span: Attends with cues  to redirect  Memory: Appears intact  Orientation Level: Oriented X4  Following Commands: Follows one step commands consistently;Follows 2-3 step commands with repetition  Problem Solving: Assistance required to identify errors made  Sequencing: Intact  Initiation: Good  Safety Awareness: Fair awareness of safety precautions  Barriers to Learning: Language    Vision   Current Vision: Wears glasses all the time  Tracking: Able to track in all quadrants    Neurological Evaluation (if indicated)   Neuro Deficits: No  Inattention/Neglect: Appears Intact  Midline Orientation: Grossly Intact  Motor Control: Grossly intact all extremities  RUE Tone: Within Functional Limits  LUE Tone: Within Functional Limits  Head Control and Alignment: Within Functional Limits  Trunk Control: Within Functional Limits    Pain Assessment   Patient complains of pain: No    Patient Status   Activity Tolerance: Good  Oxygen Needs: Room Air  Response to Treatment: Tolerated treatment well  Compliance with Precautions: Fair  Call light in reach: Yes  Presentation post treatment: Up in chair;Lines/drains intact;On cardiac monitor;Chair alarm on  Comments: Pt tolerated re-evaluation well. Pt appear to be functioning close to her baseline with use of FWW. Overall requiring CGA for functional mobility and ADLs. Pt fully conversive though was speaking in combination of English, [place], and [place]. Pt able to follow all commands, can be slighty impulsive but may be baseline. Pt has strong family support at home, and is ok to d/c from OT standpoint.    Interdisciplinary Communication   Interdisciplinary Communication: Nurse    ASSESSMENT   Inpatient Recommendation: OT evaluate & discharge;No acute care OT needs    OT Recommendations   Discharge Recommendation: Occupational Therapy;Would benefit from intensive therapy upon discharge  Discharge concerns: Requires supervision for mobility;Requires supervision for self care  Discharge Equipment Recommended: Defer to discharge facility;If patient dc home instead of rehab as recommended;Walker;Shower Chair    AM-PAC   AM-PAC Daily Activity Raw Score: 18  AM-PAC Daily Activity t-Scale Score: 38.66  AM-PAC Daily Activity CMS 0-100% Score: 46.65 %  AM-PAC Daily Activity CMS 'G Code' Modifier: CK    Evaluation Completed by: Myrlene Broker, OT,  11/16/2022

## 2022-11-17 LAB — Glucose,POC
GLUCOSE,POC: 115 mg/dL — ABNORMAL HIGH (ref 65–99)
GLUCOSE,POC: 119 mg/dL — ABNORMAL HIGH (ref 65–99)
GLUCOSE,POC: 90 mg/dL (ref 65–99)

## 2022-11-17 LAB — CBC: HEMOGLOBIN: 10.2 g/dL — ABNORMAL LOW (ref 11.6–15.2)

## 2022-11-17 LAB — Basic Metabolic Panel
CHLORIDE: 105 mmol/L (ref 96–106)
SODIUM: 136 mmol/L (ref 135–146)

## 2022-11-17 MED ORDER — CALCIUM CARBONATE ANTACID 500 (200 CA) MG PO CHEW
1000 mg | Freq: Two times a day (BID) | ORAL
Start: 2022-11-17 — End: ?

## 2022-11-17 MED ORDER — POLYETHYLENE GLYCOL 3350 17 G PO PACK
17 g | Freq: Every day | ORAL
Start: 2022-11-17 — End: ?

## 2022-11-17 MED ORDER — DEXAMETHASONE 2 MG PO TABS
ORAL
Start: 2022-11-17 — End: 2022-11-19

## 2022-11-17 MED ORDER — DSS 100 MG PO CAPS
100 mg | Freq: Two times a day (BID) | ORAL
Start: 2022-11-17 — End: ?

## 2022-11-17 MED ORDER — MELATONIN 5 MG PO TABS
5 mg | Freq: Every evening | ORAL | 0 refills
Start: 2022-11-17 — End: ?

## 2022-11-17 MED ORDER — HYDROXYZINE HCL 25 MG PO TABS
25 mg | Freq: Four times a day (QID) | ORAL | 1.00 refills | 10.00000 days | PRN
Start: 2022-11-17 — End: ?

## 2022-11-17 MED ORDER — LEVETIRACETAM 1000 MG PO TABS
1000 mg | Freq: Two times a day (BID) | ORAL
Start: 2022-11-17 — End: ?

## 2022-11-17 MED ORDER — ACETAMINOPHEN 325 MG PO TABS
650 mg | Freq: Four times a day (QID) | ORAL | PRN
Start: 2022-11-17 — End: ?

## 2022-11-17 MED ORDER — HEPARIN SODIUM (PORCINE) 5000 UNIT/ML IJ SOLN
5000 [IU] | Freq: Two times a day (BID) | SUBCUTANEOUS | 0.00 refills | 13.00000 days
Start: 2022-11-17 — End: ?

## 2022-11-17 MED ORDER — BISACODYL 10 MG RE SUPP
10 mg | Freq: Every day | RECTAL | PRN
Start: 2022-11-17 — End: ?

## 2022-11-17 MED ORDER — PANTOPRAZOLE SODIUM 40 MG PO TBEC
40 mg | Freq: Every day | ORAL | 1.00 refills | 30.00000 days
Start: 2022-11-17 — End: ?

## 2022-11-17 MED ORDER — FLEET ENEMA 7-19 GM/118ML RE ENEM
1 | Freq: Every day | RECTAL | PRN
Start: 2022-11-17 — End: ?

## 2022-11-17 MED ADMIN — POLYETHYLENE GLYCOL 3350 17 G PO PACK: 17 g | ORAL | @ 15:00:00 | Stop: 2022-12-17

## 2022-11-17 MED ADMIN — HEPARIN SODIUM (PORCINE) 5000 UNIT/ML IJ SOLN: 5000 [IU] | SUBCUTANEOUS | @ 04:00:00 | Stop: 2022-11-19 | NDC 00409272330

## 2022-11-17 MED ADMIN — CEFAZOLIN SODIUM-DEXTROSE 1-4 GM/50ML-% IV SOLN: 1 g | INTRAVENOUS | @ 02:00:00 | Stop: 2022-11-17 | NDC 00338350341

## 2022-11-17 MED ADMIN — DEXAMETHASONE 6 MG PO TABS: 6 mg | ORAL | @ 15:00:00 | Stop: 2022-11-18 | NDC 60687072911

## 2022-11-17 MED ADMIN — LIDOCAINE HCL (PF) 1 % IJ SOLN: 2 mL | INTRADERMAL | @ 17:00:00 | Stop: 2022-11-17 | NDC 63323049209

## 2022-11-17 MED ADMIN — DEXAMETHASONE 6 MG PO TABS: 6 mg | ORAL | @ 23:00:00 | Stop: 2022-11-18 | NDC 60687072911

## 2022-11-17 MED ADMIN — DOCUSATE SODIUM 100 MG PO CAPS: 100 mg | ORAL | @ 15:00:00 | Stop: 2022-11-19 | NDC 00904718361

## 2022-11-17 MED ADMIN — ACETAMINOPHEN 325 MG PO TABS: 650 mg | ORAL | @ 17:00:00 | Stop: 2022-11-18 | NDC 71399801401

## 2022-11-17 MED ADMIN — PSYLLIUM PO PACK (MULTI-GPI): 1 | ORAL | @ 03:00:00 | Stop: 2022-11-17

## 2022-11-17 MED ADMIN — DOCUSATE SODIUM 100 MG PO CAPS: 100 mg | ORAL | @ 03:00:00 | Stop: 2022-12-10 | NDC 00904718361

## 2022-11-17 MED ADMIN — PANTOPRAZOLE SODIUM 40 MG PO TBEC: 40 mg | ORAL | @ 15:00:00 | Stop: 2022-11-19 | NDC 50268063911

## 2022-11-17 MED ADMIN — LEVETIRACETAM 500 MG PO TABS: 1000 mg | ORAL | @ 17:00:00 | Stop: 2022-11-19 | NDC 51079082101

## 2022-11-17 MED ADMIN — MELATONIN 5 MG PO TABS: 5 mg | ORAL | @ 03:00:00 | Stop: 2022-12-13

## 2022-11-17 MED ADMIN — HEPARIN SODIUM (PORCINE) 5000 UNIT/ML IJ SOLN: 5000 [IU] | SUBCUTANEOUS | @ 15:00:00 | Stop: 2022-11-19 | NDC 00409272330

## 2022-11-17 MED ADMIN — DEXAMETHASONE 6 MG PO TABS: 6 mg | ORAL | @ 07:00:00 | Stop: 2022-11-18 | NDC 60687072911

## 2022-11-17 MED ADMIN — CALCIUM CARBONATE ANTACID 500 (200 CA) MG PO CHEW: 1000 mg | ORAL | @ 15:00:00 | Stop: 2022-11-19 | NDC 68084098833

## 2022-11-17 MED ADMIN — CEFAZOLIN SODIUM-DEXTROSE 1-4 GM/50ML-% IV SOLN: 1 g | INTRAVENOUS | @ 09:00:00 | Stop: 2022-11-17 | NDC 00338350341

## 2022-11-17 MED ADMIN — ACETAMINOPHEN 325 MG PO TABS: 650 mg | ORAL | @ 02:00:00 | Stop: 2022-11-18 | NDC 71399801401

## 2022-11-17 MED ADMIN — ACETAMINOPHEN 325 MG PO TABS: 650 mg | ORAL | @ 11:00:00 | Stop: 2022-11-18 | NDC 71399801401

## 2022-11-17 MED ADMIN — HYDROXYZINE HCL 25 MG PO TABS: 25 mg | ORAL | @ 02:00:00 | Stop: 2022-12-11 | NDC 00904661761

## 2022-11-17 MED ADMIN — LACOSAMIDE 100 MG PO TABS: 100 mg | ORAL | @ 03:00:00 | Stop: 2022-11-17 | NDC 60687068711

## 2022-11-17 MED ADMIN — CALCIUM CARBONATE ANTACID 500 (200 CA) MG PO CHEW: 1000 mg | ORAL | @ 03:00:00 | Stop: 2022-12-08 | NDC 68084098833

## 2022-11-17 MED ADMIN — ACETAMINOPHEN 325 MG PO TABS: 650 mg | ORAL | @ 07:00:00 | Stop: 2022-11-18

## 2022-11-17 NOTE — Discharge Summary
NEUROSURGERY DISCHARGE SUMMARY      PATIENT:  [name]    MRN:  [id number]  DOB:  Jan 05, 1946    Attending Physician: Ellyn Hack., MD  Primary Care Physician:  Lawanna Kobus, MD    Admission Date:  11/04/2022    Discharge Date:  11/18/2022    Discharge Diagnosis:    Brain mass [G93.89]    Hospital Diagnoses   Patient Active Problem List   Diagnosis    Brain mass     Present on Admission:   Brain mass      Surgery Performed:   Procedure(s) (LRB):  craniotomy for hematoma evacuation (Left)    HPI: [name] is a 77 y.o. year old female not on AC/AP who presents with 2 weeks of progressive speech difficulty that she describes as 'the words coming out wrong' as well as lethargy. Language problems are equally present when speaking in [place] (first language), [place]., and [place]. Denies any memory problems other than what she considers normal aging. Pt reports that she is otherwise in her usual state of health and denies any seizures, confusion, receptive language difficulty, sensory deficits, or gross motor deficits. Otherwise, she is very active and functional at baseline.     Brief Hospital Course: [name] is a 77 y.o. female with Brain mass [G93.89] who on 11/09/2022 was taken to the operating room at [facility] and underwent a left temporal craniotomy for tumor resection. Please refer to operation report for details. The patient tolerated the operation well, without complications, and was admitted for standard postoperative management and monitoring in stable conditions. The patient was transferred to 6N unit in stable condition. On 11/14/22, a CT brain showed an epidural hematoma with mass effect on the inferior frontal gyrus consistent with new worsening expressive aphasia. The patient was taken to the OR for hematoma evacuation. Her neurological exam improved post-operatively. The patient was started on appropriate DVT prophylaxis, a bowel regimen, a regular diet, and appropriate home medications. The patient's foley catheter was discontinued and the patient was able to void without difficulty.     Physical therapy and occupational therapy evaluated the patient and recommended: ARU    By day of discharge, the patient had a stable neurologic exam, was breathing easily on room air, had return of bowel function, was tolerating a diet, and had adequate pain control with oral medication. The patient was deemed stable for discharge.    Physical Exam:    Temp:  [36 ?C (96.8 ?F)-37.4 ?C (99.3 ?F)] 36.6 ?C (97.9 ?F)  Heart Rate:  [52-69] 69  Resp:  [16-22] 16  BP: (103-145)/(56-78) 145/75  NBP Mean:  [72-95] 95  SpO2:  [95 %-97 %] 97 %  On RA    Awake, alert and oriented x 3.    In English: Naming 3/3, intact repetition, minimal paraphasic errors  PERRL. Follows commands   Full strength  Incision open to air, c/d/I.      Skin: new left eye swelling (photo in media tab) numbness per patient   Chest: symmetrical chest rise, CTA in all lobes, NSR, (-)murmur  Vascular: 2+ pulses throughout (-)edema, cyanosis  ABD: soft, nontender, flat, normoactive bowel sounds      LINES AND DRAINS  CENTRAL LINE: None  DRAINS: None  FOLEY: None  SUTURE/STAPLE:  absorbable    Assessment and Plan:    NEURO  Brain tumor s/p L temporal crani, tumor rxn 4/3  Expressive aphasia   Hyponatremia  Hematoma with mass effect on inferior frontal gyrus s/p evacuation on 4/8   cont 0.2 Flurinef bid > stopped  salt tabs 1g bid 3/31 am > stopped  3% NaCl 12.5cc/h 4/1 > stopped 4/2  Na daily starting 4/5  SLP speech/cognitive eval complete  NA goal 135-145)  4/8 CT brain perfusion completed    4/9 Needs PT/OT   Continue to monitor JP drain      Vasogenic edema (POA)   AED: Keppra 1000mg  po bid to follow-up, vimpat 100 mg bid x7 d (4/10)  Steroid: 4/8 Dexamethasone increased to 8mg  Q8 PO taper to off   Suture/staple removal on POD#14 . 4/18        PAIN  Post operative pain Tylenol and oxycodone PRN     RESPIRATORY  No active issues  Incentive spirometry     CARDIOVASCULAR  Hypertension  Bradycardia   Goal SBP 90-160  Keep K >4 and Mg >2  DC propranolol per IMCS     GU / RENAL  No active issues  Continue to monitor UOP     ID  Leukocytosis, afebrile 2/2 dexamethasone  Antibiotics: None   Continue to monitor WBC  F/U UA, procal, lactate       HEME / ONC  No active issues  Keep Hgb >7  Continue to monitor H&H     ENDOCRINE  Hyperglycemia  Glucose goal 100-140   Glycemic control with SSI     GI / HEPATIC  Constipation   Diet: Regular   Nausea management: zofran PRN  Bowel regimen  Ulcer prophylaxis     INTEGUMENTARY  Left eye swelling, improved   New on 4/7  Dermatology consult  Rec focal U.S.     PPx  DVT prophylaxis: SQH,  SCDs    Disposition: Acute Rehab    Discharge Condition: Stable    Diet: Regular      Discharge Instructions: Detailed Neurosurgery discharge instructions given and discussed with patient prior to discharge.    Post-Discharge Appointments:   The patient was asked to follow up with neurosurgery for a postoperative visit. Details, including time of postoperative visit, office location, and office phone number was given to the patient directly.          NEUROSURGERY DISCHARGE INSTRUCTIONS:  Please, review the following discharge instructions. They will answer many common questions patients have after surgery.    DIET: You may resume the type of diet you had before surgery, unless changed by your doctor during your stay at the hospital. Eating a well-balanced diet is important for proper wound healing.    DISCHARGE MEDICATIONS:     Medication List        START taking these medications      acetaminophen 325 mg tablet  Commonly known as: Tylenol  Take 2 tablets (650 mg total) by mouth every six (6) hours as needed for Pain.     bisacodyl 10 mg suppository  Commonly known as: Dulcolax  Place 1 suppository (10 mg total) rectally daily as needed for Constipation. calcium carbonate 500 mg chewable tablet  Commonly known as: Tums  Chew 2 tablets (1,000 mg total) by mouth two (2) times daily Calcium Carbonate 500 mg is equivalent to 200 mg elemental Calcium.     dexAMETHasone 2 mg tablet  Take 2 tablets (4 mg total) by mouth three (3) times daily for 2 days, THEN 1 tablet (2 mg total) three (3) times daily for 2 days, THEN 1 tablet (2 mg  total) two (2) times daily for 2 days, THEN 1 tablet (2 mg total) daily for 2 days.  Start taking on: November 18, 2022     docusate 100 mg capsule  Commonly known as: Colace  Take 1 capsule (100 mg total) by mouth two (2) times daily.     heparin 5000 unit/mL injection  Inject 1 mL (5,000 Units total) under the skin two (2) times daily.     hydrOXYzine 25 mg tablet  Take 1 tablet (25 mg total) by mouth every six (6) hours as needed for Anxiety.     levETIRAcetam 1000 mg tablet  Commonly known as: Keppra  Take 1 tablet (1,000 mg total) by mouth two (2) times daily.     melatonin 5 mg tablet  Take 1 tablet (5 mg total) by mouth at bedtime.     pantoprazole 40 mg DR tablet  Commonly known as: Protonix  Take 1 tablet (40 mg total) by mouth daily.     polyethylene glycol powder packet  Commonly known as: Glycolax  Take 1 packet (17 g total) by mouth daily.     sodium phosphate 7-19 g/118 mL ADULT enema  Place 1 enema rectally daily as needed.            CONTINUE taking these medications      amLODIPine 10 mg tablet  Commonly known as: Norvasc     estradiol 0.5 mg tablet  Commonly known as: Estrace     GLUCOSAMINE PO     medroxyPROGESTERone 5 mg tablet  Commonly known as: Provera     METAMUCIL FIBER PO     naproxen 250 mg tablet  Commonly known as: Naprosyn     omeprazole 20 mg DR capsule  Commonly known as: PriLOSEC     vitamin C 1000 MG tablet     Vitamin D-3 125 mcg (5000 units) Tabs     vitamin E 180 mg (400 units) capsule               Where to Get Your Medications        Information about where to get these medications is not yet available Ask your nurse or doctor about these medications  acetaminophen 325 mg tablet  bisacodyl 10 mg suppository  calcium carbonate 500 mg chewable tablet  dexAMETHasone 2 mg tablet  docusate 100 mg capsule  heparin 5000 unit/mL injection  hydrOXYzine 25 mg tablet  levETIRAcetam 1000 mg tablet  melatonin 5 mg tablet  pantoprazole 40 mg DR tablet  polyethylene glycol powder packet  sodium phosphate 7-19 g/118 mL ADULT enema       Start taking the medications you took prior to surgery unless directed otherwise by your physician. Should you have questions about this please ask your physician prior to discharge.    Your doctor will provide you with prescriptions for the medication you are to take at home. You may fill your prescriptions at the [facility], or you can have them filled at a pharmacy closer to your home.    Before your discharge, your nurse will review with you and write down your medication dosage, schedule, and side effects. It is important to take your medications as ordered and try to stay on schedule.    Do NOT take ibuprofen, aspirin, coumadin, xarelto or any other anti-platelet or anti-coagulation medication unless instructed to do so by your surgeon.    If you are unsure if a medication is safe, ask your physician and/or  pharmacist.    COMFORT AND PAIN MANAGEMENT:  It is common to have a headache and/or pain after surgery. This may last a few days or a few weeks. You will have pain medications prescribed by your doctor for your pain management. The medication may be irritating to the stomach lining, it is advisable to take it with a teaspoon of applesauce or non-fat yogurt.    Pain medication (narcotics) may cause constipation. A high-fiber diet may help if this occurs. If your symptoms do not resolve, use a stool softener or over the counter gentle laxative. If the medications are ineffective, call your doctor?s office to discuss on-going pain management.    If you should become constipated there are many agents that you can obtain from any pharmacy over the counter. These include:   -For mild constipation: Colace, Senna, Milk of Magnesium, Miralax, Dulcolax tablet.      -For moderate constipation: Milk of Magnesium, Dulcolax suppository                -For severe constipation: Please speak to your primary care physician.     You may also experience nightmares, hallucinations and sweating from your pain medications. Please let you doctor know if these symptoms occur.    Some medications such as Percocet, Vicodin and Norco contain Tylenol (acetaminophen). Avoid taking these medications more frequently then directed; too much Tylenol can cause liver damage. Do NOT take over-the-counter products that contain Tylenol/Acetaminophen if you are already on these medications.    DO NOT drive while taking narcotics!      Eye/facial swelling is common after surgery and may take a few days to a week to disappear. Bruising may occur and will take one to two weeks to resolve. You may feel better if you sleep with two pillows under your head; keeping your head elevated will help reduce facial swelling.    OVERVIEW OF DAILY ACTIVITIES:    You may feel more tired for 1-3 weeks after surgery. Get plenty of rest.     WALKING: Increase your activities slowly.  Start with a gradual walking program of 5-10 minutes 2-4 times a day.  Every few days, try to increase each session by a few minutes. You should walk every day. Walking will improve circulation, increase your feeling of well-being, and prevent pulmonary problems.    LIFTING:  Do not lift more than 10lbs for at least 2 weeks or until your surgeon tells you to do so. Do not participate in sports or activities that increase your risk for head injury such as contact sports, bike riding, soccer, football, skateboarding.    OTHER ACTIVITIES: Minimize activities that require you to bend your head downward (may increase headache) such as yoga, bending forward at the waist to pick up objects. Due to seizure risk, do not swim or hike alone. Avoid straining when having a bowel movement. Take laxatives as prescribed.    When you see your surgeon in the follow-up appointment, he or she will discuss decreasing the limits on activity at that time. You may resume sexual intimacy when you feel well enough, but do not overexert yourself. You must have clearance from your doctor before participating in any strenuous exercises/activity.    The brain will take roughly 2-6 weeks to heal. You may be very fatigued during this time. You may hear popping or dripping noises inside your head, but this is just a result of air and fluid reabsorbing after your surgery.    RESUME  TO WORK/DRIVING/AIR TRAVEL:  You must have clearance from your doctor before returning to work, driving a car, or flying. This will be discussed at your postoperative visit. Do not drive while you are on prescription pain medications.     WOUND/SUTURE CARE:  Keep incision clean and dry at all times.    Your incision will have either staples or sutures.    BATHING/INCISIONAL CARE: You may shower or bathe within 24 hours after surgery, however do not get your incision(s) wet until 4 days after surgery. We recommend that you shower with someone in the bathroom to assist you. Wash, do not scrub your incision. Pat dry the incisions after showering. Do not immerse your incisions in standing bodies of water (bathtub, pool, ocean) for 4 weeks or until cleared by your surgeon at your follow up appointment. Your incision may be open to air; however you may cover the incision with a clean cap, scarf or hat.    SURGICAL DRESSINGS AND SITE CARE: You may remove your head wrap and/or dressings unless already removed or otherwise instructed 48 hours after surgery. There will likely be dried blood on the wrap and/or dressing. Your incision will have either staples or sutures. Keep the surgical incision clean and dry. You do not need to cover up the dressing at this point. If you have an abdominal or thigh incision, keep area clean and open to air. Cover with plastic wrap before showering. The ?Steri-strips? on the incision will come off on their own. You may gently wash your abdominal or thigh incision with soap and water 4 days after your surgery.    SIGNS TO WATCH FOR AT HOME:  Always try to call your doctor first if you are experiencing any of the symptoms below:  - Onset of severe, persistent headache not relieved by medication and rest.  - Onset of increased drowsiness, confusion, stiff neck, or nausea and vomiting.  - Any new onset or worsening of visual problems.  - Any new onset or worsening of speech problems.  - Any new onset or worsening of swallowing problems.  - Any new onset of or increased weakness, numbness or tingling.  - Any new onset of or increased seizures.  - New weakness or sensory changes  - Fever>101o F  - Shortness of breath  - Chest pain  - Calf/leg pain  - Redness, warmth, swelling or unusual drainage from wound(s)  - Bright red or excessive drainage from the incision(s)  - Drainage that is foul-smelling  - Stitches become loose    For life-threatening emergencies that cannot wait, please go to the nearest Emergency Room for immediate evaluation or dial 911.    HOW TO REACH [place]:  We are available to you at all times.     During business hours, please call [facility]: [phone].     After 5:00 PM, please call the [facility] page operator: [phone]. Ask to have the neurosurgical resident on call contacted for urgent questions.    FOLLOW UP:  You should be seen in our post-operative clinic approximately 2 weeks after your surgery. The physician who discharges you from the hospital will make sure you have a follow up appointment scheduled with your surgeon.    REHABILITATION NEEDS:  If indicated, our rehabilitation professional will assess you prior to your discharge. We will order any rehabilitation needs and equipment prior to your discharge.    FOLLOW-UP APPOINTMENTS:    Follow up with your primary care physician in 1 week  after discharge from acute rehab for any health maintenance needs.  Your sutures are absorbable and do not require removal.    Follow up with your neurosurgeon Dr. Emeline Darling as scheduled below. Office: [phone]  Donzetta Kohut, your patient navigator, will contact you after you discharge to arrange oncological follow up. She can be reached at [phone] with any questions or concerns.  You currently have the following appointments already scheduled:     Future Appointments   Date Time Provider Department Center   11/25/2022  8:00 AM Ellyn Hack., MD [facility] [place]       I spent > 30 minutes examining the patient, preparing discharge documents including: discharge records, prescriptions, referral forms, instructions, and communicating discharge plan with the patient, family, and all relevant care providers.    The patient was seen and examined by the Neurosurgery team.  Above plan was discussed with neurosurgery resident team and attending physician who were in agreement with the plan.    Author: Franco Nones, NP  11/18/2022 10:59 AM  Department of Neurosurgery

## 2022-11-17 NOTE — Other
Patient's Clinical Goal:   Clinical Goal(s) for the Shift: Patient safety and comfort  Identify possible barriers to advancing the care plan: none  Stability of the patient: Moderately Unstable - medium risk of patient condition declining or worsening    Progression of Patient's Clinical Goal: A/Ox4, OOB with walker. JP drain to gravity with 15cc out.  Pain managed with Tylenol. Turban dsng CDI. Plan: possible d/c of JP today. ARU when medically ready

## 2022-11-17 NOTE — Procedures
Drain Removal Note   Proceduralist: Fabiola Backer NP  Attending: Emeline Darling, MD    INDICATION: Jackson-Pratt is required for management of post-op fluid.    The procedure was explained to the patient and family. A time-out procedure to confirm patient identification, site and procedure was performed with the patient and staff RN.    PROCEDURE:  Patient has a Jackson-Pratt drain in the subgaleal space, entering the posterior scalp. The skin overlying the insertion site of the J-P drain was injected with 1% lidocaine for local anesthetic. Skin suture securing the drain was removed. The J-P drain was removed from bellows suction and the catheter gently removed with ease. The insertion site was sutured closed with Monocryl. The incision was left open to air. The patient tolerated the procedure well without complications.

## 2022-11-17 NOTE — Progress Notes
NEUROSURGERY PROGRESS NOTE     DATE OF SERVICE:  11/17/2022    PATIENT: [name]        DOB: 01-06-1946    ATTENDING PHYSICIAN:  Ellyn Hack., MD    ADMISSION DATE:  11/04/2022    LENGTH OF STAY:  LOS: 13 days     CHIEF COMPLAINT: brain tumor     ACTIVE MEDICAL PROBLEMS:   Patient Active Problem List   Diagnosis    Brain mass       Present on Admission:   Brain mass      HPI:   [name] is a 77 y.o. year old female not on AC/AP who presents with 2 weeks of progressive speech difficulty that she describes as 'the words coming out wrong' as well as lethargy. Language problems are equally present when speaking in [place] (first language), [place]., and [place]. Denies any memory problems other than what she considers normal aging. Pt reports that she is otherwise in her usual state of health and denies any seizures, confusion, receptive language difficulty, sensory deficits, or gross motor deficits. Otherwise, she is very active and functional at baseline.    INTERVAL EVENTS:   3/30: Consulted IMCS for pre operative clearance. Pending OR for 11/09/22 with Dr. Allena Katz .   3/31: Na 132 (134). Discont 75cc NaCl. 0.2mg  bid Florinef. 1g salt tab bid today. Neurointact at bedside including language. Sitting bedside, eating, walking. Plan for OR 4/3. Cleared IMCS.  4/1: AFVSS. NAEON. Na 134 (135). Florinef, salt tabs, 3% 12.5cc/h. Dex/Keppra. OR Weds 4/3.  4/2: AFVSS. NAEON. Na 136 (135). Florinef, salt tabs, 3% 12.5cc/h. Dex/Keppra. OR Weds 4/3. Functional MRI scheduled for 6 am. 3%NaCL stopped. NPO P MN/IVF  4/4: POD1 s/p L temporal crani for tumor; MRI pending. NAEON, AF VSS. WBC 34.87 (37.52), Hb 10.5 (11). Na 139. Foley. PT/OT.  4/5: POD2. AF VSS. MRI complete pending read. WBC 29. Hb 9.8. Na 140. Keppra to followup. Decadron taper to 4 tid indef. Discont foley yest. Dispo ARU per PT/OT. SLP cog eval complete.  4/6: POD3. awaiting ARU   4/7: POD4. NAEON AF VSS. WBC 16.73 (19.37), Hb 10.2 (9.3). Na 139 (140). Dex 4 mg, gluc 116 +protonix. Keppra/Vimpat. Noted new left eye swelling, derm consult. Bmx2. PT/OT. SLP therapy for language. Pending ARU.   4/8: Pt experiencing global aphasia with NO extremity weakness.  CT perfusion brain ordered and will r/o any potential infections. Dexamethasone increased to 8mg .  CT showed hematoma with mass effect on inferior frontal gyrus consistent with new worsening expressive aphasia. We will plan for OR for evacuation to restore language function and decrease mass effect   4/9: Continue to monitor JP drain  4/10: Continue to monitor JP drain. JP drain changed to gravity. Pending PT/OT eval today. No abx per Dr. Allena Katz.   4/11: Headwrap and JP drain removed. ARU pending insurance authorization.    OBJECTIVE:     Vital Signs:   Temp:  [36.4 ?C (97.5 ?F)-36.9 ?C (98.4 ?F)] 36.4 ?C (97.5 ?F)  Heart Rate:  [53-77] 60  Resp:  [14-23] 23  BP: (109-150)/(51-72) 150/72  NBP Mean:  [68-94] 94  SpO2:  [96 %-97 %] 97 %  On RA    GENERAL: Comfortable, breathing comfortably, resting in bed in no apparent distress    NEURO EXAM:  Awake, alert and oriented x 3.    In English: Naming 3/3, intact repetition, minimal paraphasic errors  PERRL. Follows commands   Full  strength  Incision open to air, c/d/I.     Skin: new left eye swelling (photo in media tab) numbness per patient   Chest: symmetrical chest rise, CTA in all lobes, NSR, (-)murmur  Vascular: 2+ pulses throughout (-)edema, cyanosis  ABD: soft, nontender, flat, normoactive bowel sounds     LINES AND DRAINS  CENTRAL LINE: None  DRAINS: None  FOLEY: None  SUTURE/STAPLE: 2 weeks post-op (4/17)    DATA   Pre-operative surgical visit and medical clearance visit documentation obtained and reviewed. Clinical lab tests reviewed daily. All associated imaging for this patient was independently reviewed and interpreted as listed below:    Post-op imaging:     11/11/22 MRI brain w/wo  Pending read    11/04/22 CT brain outside imaging   1. Large mixed density mass centered in the left anterior and mid temporal lobe with moderate perilesional vasogenic edema, regional mass effect and midline shift from left to right at the level of septum pellucidum of 6 mm. 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  2. There is no evidence of subfalcine or uncal herniation.  3. No hydrocephalus.    11/14/22 CT brain wo contrast   Redemonstrated postsurgical changes of left frontotemporal craniotomy and anterior temporal mass resection. Redemonstrated hematoma beneath the the craniotomy flap which measures up to 13 mm in thickness and contributes to 6 mm of rightward midline shift, along with fluid and blood products in the resection cavity.     CT perfusion: There is a region of prolonged Tmax in the region immediately surrounding the left anterior temporal surgical cavity which is slightly reduced in size following the administration of Diamox, indicating slightly improved transit. In the setting of evolving postoperative changes with fluid and hematomas in the region as above described, this is of unknown clinical significance. There is clinical concern for vasospasm, consider dedicated vascular imaging with CTA, MRA, or conventional diagnostic angiogram of the brain.       11/14/22 CT brain w WO contrast   Near complete evacuation of left epidural hematoma resulting in improvement in the mass effect and resolution of the midline shift.  No evidence of aneurysm, pseudoaneurysm or active bleeding.    ASSESSMENT AND PLAN:   85F admitted to inpatient pending OR, craniotomy for tumor resection.    NEURO  Brain tumor s/p L temporal crani, tumor rxn 4/3  Expressive aphasia, improving   Hyponatremia, resolved  Hematoma with mass effect on inferior frontal gyrus s/p evacuation on 4/8   4/8 CT brain perfusion completed    JP to gravity - dc'd 4/11  SLP speech/cognitive eval completed  NA goal 135-145    Vasogenic edema (POA)   AED: Keppra 1000mg  po bid till follow-up, vimpat 100 mg bid x7 d (completed)  Steroid: 4/8 Dexamethasone increased to 8mg  Q8 PO taper to off   Suture/staple removal on POD#14 . 4/18    PAIN  Post operative pain   Tylenol and oxycodone PRN    RESPIRATORY  No active issues  Incentive spirometry    CARDIOVASCULAR  Hypertension  Bradycardia, table  Goal SBP 90-160  Keep K >4 and Mg >2  DC propranolol per IMCS    GU / RENAL  No active issues  Continue to monitor UOP    ID  Leukocytosis, afebrile 2/2 dexamethasone  Antibiotics: None   Continue to monitor WBC  F/U UA (NEG), procal (NEG), lactate      HEME / ONC  No active  issues  Keep Hgb >7  Continue to monitor H&H    ENDOCRINE  Hyperglycemia  Glucose goal 100-140   Glycemic control with SSI    GI / HEPATIC  Constipation   Diet: Regular   Nausea management: zofran PRN  Bowel regimen  Ulcer prophylaxis    INTEGUMENTARY  Left eye swelling, improved   New on 4/7  Dermatology consult  Rec focal [place]    PPx  DVT prophylaxis: SQH,  SCDs    Disposition  PT/OT: ARU pending insurance auth      The patient was examined and discussed with the resident Neurosurgery team & Attending Ellyn Hack., MD who agrees with assessment and plan listed above.    Author:    Franco Nones, NP   9:35 AM 11/17/2022  Department of Neurological Surgery

## 2022-11-17 NOTE — Consults
IP CM ACTIVE DISCHARGE PLANNING  Department of Care Coordination      Admit [id number]  Anticipated Date of Discharge: 11/18/2022    Following QV:[id number], Marquis Buggy., MD      Today's short update     Per chart review and IDR patient is not medically ready for dsicharge. s/p L temporal craniotomy for tumor on 11/09/22 and s/p craniotomy for hematoma evacuation 4/9. JP drain removed today.  Disposition  for ARU- [facility] in [place] accepted. CM spoke with Debby Bud at [facility] and he confirmed they do have beds for today but are they are still pending insurance auth.  CM aslo spoke with [name], patient's daughter and gave update on progress of discharge planning. [name] stated that she also spoke with Debby Bud this morning and is aware of the barrier.    CM will continue to follow and assist for safe discharge planning with Primary Medical and Interdisciplinary Teams as needed.    Disposition     Acute Rehab Unit  [facility] [place] [address] [place], [place] [address]    Family/Support System in agreement with the current discharge plan: Yes, in agreement and participating    Freedom of Choice     Treatment Preferences: Addressed  Discharge Goals: Addressed  Freedom of Choice: Educated and Provided      Aidin Choice List: Provided to Pt/Family  Date Provided: 11/11/22         Facility Transfer/Placement Status (if applicable)     Facility accepted (7/7), Authorization in progress (5/7)    [facility] [place]  [address]  [place], [place] [address]  Phone: [phone]  Fax: [phone]  Contact Person: Debby Bud / Ph: [phone]    Medical Transport Arrangement Status (if applicable)     Transportation needed (1/6)       Other Arrangements (if applicable)     Other: [facility] [place] [address] [place], [place] [address]  Agency: [facility]     Phone Number: [phone]  Fax Number: [phone]     CM will continue to follow and assist for safe discharge planning with Primary Medical and Interdisciplinary Teams as needed.      Myles Rosenthal, BSN, RN  Clinical Case Manager, Ph# [phone] Pager [phone]    The Department of Care Coordination - Telephone: [phone], Fax: [phone]    Swing Shift CM (After Business Hours 8AM-5PM Mon thru Fri):   Salome Holmes (Pager [phone], Office: [phone]) or   Juanito Doom (Pager [phone], Office: [phone]: [phone])    If Swing Shift CM unavailable, then contact ED CM (pager [phone], Office: [phone]).     For CM assistance during weekend/holiday-contact covering CM at Pager [phone].       11/17/2022

## 2022-11-18 LAB — CBC: MEAN PLATELET VOLUME: 9.5 fL (ref 9.3–13.0)

## 2022-11-18 LAB — Glucose,POC
GLUCOSE,POC: 126 mg/dL — ABNORMAL HIGH (ref 65–99)
GLUCOSE,POC: 125 mg/dL — ABNORMAL HIGH (ref 65–99)
GLUCOSE,POC: 104 mg/dL — ABNORMAL HIGH (ref 65–99)
GLUCOSE,POC: 109 mg/dL — ABNORMAL HIGH (ref 65–99)

## 2022-11-18 LAB — Basic Metabolic Panel
ESTIMATED GFR 2021 CKD-EPI: >89 mL/min/{1.73_m2} (ref 8–19)
UREA NITROGEN: 21 mg/dL (ref 7–22)

## 2022-11-18 LAB — FISH: LAB AP MDL CYTOG METHODS: NEGATIVE {titer}

## 2022-11-18 MED ORDER — DEXAMETHASONE 2 MG PO TABS
ORAL | 0 refills
Start: 2022-11-18 — End: ?

## 2022-11-18 MED ADMIN — HEPARIN SODIUM (PORCINE) 5000 UNIT/ML IJ SOLN: 5000 [IU] | SUBCUTANEOUS | @ 15:00:00 | Stop: 2022-11-19 | NDC 00409272330

## 2022-11-18 MED ADMIN — DEXAMETHASONE 6 MG PO TABS: 6 mg | ORAL | @ 06:00:00 | Stop: 2022-11-18 | NDC 60687072911

## 2022-11-18 MED ADMIN — LEVETIRACETAM 500 MG PO TABS: 1000 mg | ORAL | @ 03:00:00 | Stop: 2022-11-19 | NDC 51079082101

## 2022-11-18 MED ADMIN — MELATONIN 5 MG PO TABS: 5 mg | ORAL | @ 03:00:00 | Stop: 2022-11-19

## 2022-11-18 MED ADMIN — POLYETHYLENE GLYCOL 3350 17 G PO PACK: 17 g | ORAL | @ 15:00:00 | Stop: 2022-11-19

## 2022-11-18 MED ADMIN — ACETAMINOPHEN 325 MG PO TABS: 650 mg | ORAL | @ 01:00:00 | Stop: 2022-11-18 | NDC 71399801401

## 2022-11-18 MED ADMIN — DEXAMETHASONE 4 MG PO TABS: 4 mg | ORAL | @ 15:00:00 | Stop: 2022-11-19 | NDC 60687071811

## 2022-11-18 MED ADMIN — ACETAMINOPHEN 325 MG PO TABS: 650 mg | ORAL | @ 06:00:00 | Stop: 2022-11-18 | NDC 71399801401

## 2022-11-18 MED ADMIN — CALCIUM CARBONATE ANTACID 500 (200 CA) MG PO CHEW: 1000 mg | ORAL | @ 03:00:00 | Stop: 2022-12-08 | NDC 68084098833

## 2022-11-18 MED ADMIN — PANTOPRAZOLE SODIUM 40 MG PO TBEC: 40 mg | ORAL | @ 15:00:00 | Stop: 2022-11-19 | NDC 50268063911

## 2022-11-18 MED ADMIN — DOCUSATE SODIUM 100 MG PO CAPS: 100 mg | ORAL | @ 15:00:00 | Stop: 2022-11-19 | NDC 00904718361

## 2022-11-18 MED ADMIN — HEPARIN SODIUM (PORCINE) 5000 UNIT/ML IJ SOLN: 5000 [IU] | SUBCUTANEOUS | @ 03:00:00 | Stop: 2022-11-19 | NDC 00409272330

## 2022-11-18 MED ADMIN — DOCUSATE SODIUM 100 MG PO CAPS: 100 mg | ORAL | @ 03:00:00 | Stop: 2022-11-19 | NDC 00904718361

## 2022-11-18 MED ADMIN — CALCIUM CARBONATE ANTACID 500 (200 CA) MG PO CHEW: 1000 mg | ORAL | @ 15:00:00 | Stop: 2022-11-19 | NDC 68084098833

## 2022-11-18 MED ADMIN — LEVETIRACETAM 500 MG PO TABS: 1000 mg | ORAL | @ 15:00:00 | Stop: 2022-11-19 | NDC 51079082101

## 2022-11-18 NOTE — Progress Notes
11/18/22 1453   Time Calculation   Start Time 1453   Last PT Treatment Date 11/15/22  (Attempted 4/12)   Patient not seen due to Other (Comment)  (Attempted to see for PT. Transport arrived and pt preparing for discharge.)

## 2022-11-18 NOTE — Other
Patient's Clinical Goal:   Clinical Goal(s) for the Shift: pain/med management, VSS  Identify possible barriers to advancing the care plan: N/A  Stability of the patient: Moderately Stable - low risk of patient condition declining or worsening   Progression of Patient's Clinical Goal:   All necessary documents/CD placed in red packet and given to EMT. Report given to [place], [place] @ [phone]. Patient being transferred to [facility] ARU, Room 500. IV/ID band removed. All belongings sent with patient.

## 2022-11-18 NOTE — Consults
IP CM ACTIVE DISCHARGE PLANNING  Department of Care Coordination      Admit [id number]  Anticipated Date of Discharge: 11/18/2022    Following QI:[id number], Marquis Buggy., MD      Today's short update     Per chart review and IDR patient medically ready for dsicharge today. s/p L temporal craniotomy for tumor on 11/09/22 and s/p craniotomy for hematoma evacuation 4/9. JP drain removed 4/11.  Disposition  for ARU- patient accepted at [facility] in [place]. Insurance auth received late yesterday, but last avialbale bed was given away. Waiting for bed availability today.  CM left voice message at 9 AM today for Danielle Admission Coordinator at [facility]. CM  also spoke with [name], patient's daughter with update. CM spoke with Demetri at [employer] to place transport on [employer].    CM will continue to assist/collaborate with Interdisciplinary Team for post-acute needs  to facilitate a safe discharge.    Disposition     Acute Rehab Unit  [facility] [place] [address] [place], [place] [address]    Family/Support System in agreement with the current discharge plan: Yes, in agreement and participating    Freedom of Choice     Treatment Preferences: Addressed  Discharge Goals: Addressed  Freedom of Choice: Educated and Provided           Aidin Choice List: Provided to Pt/Family  Date Provided: 11/11/22     Facility Transfer/Placement Status (if applicable)     Authorization complete (6/7), Facility accepted (7/7)    Medical Transport Arrangement Status (if applicable)     Company accepted (5/6) IT consultant)       Other Arrangements (if applicable)     Other: [facility] [place] [address] [place], [place] [address]  Agency: [employer]  Contact Person: Darden Dates  Phone Number: [phone]  Fax Number: [phone]         Myles Rosenthal, BSN, RN  Clinical Case Manager, Ph# [phone] Pager [phone]    The Department of Care Coordination - Telephone: [phone], Fax: [phone]    Swing Shift CM (After Business Hours 8AM-5PM Mon thru Fri):   Salome Holmes (Pager [phone], Office: [phone]) or   Juanito Doom (Pager [phone], Office: [phone]: [phone])    If Swing Shift CM unavailable, then contact ED CM (pager [phone], Office: [phone]).     For CM assistance during weekend/holiday-contact covering CM at Pager [phone].       11/18/2022

## 2022-11-18 NOTE — Nursing Note
2100  ambulance arrived to pick up pt, but upon checking with the facility, they do not have the bed available for pt until 4/12.  Ambulance sent back and daughter updated on the phone.  Ani charge nurse and team made aware

## 2022-11-18 NOTE — Progress Notes
Physical Therapy  Discharge Summary    PATIENT: [name]  MRN: [id number]  DOB: [birth year]      Date:  11/18/2022   Therapist: Beatrice Lecher, PT       Patient has been seen for:  Bed mobility training;Transfer training;Gait training;Discharge planning;Therapeutic exercise;Patient and/or family education    Objective     See Daily Progress Notes for functional levels     Patient showing progress in: Bed mobility training;Transfer training;Gait training;Discharge planning;Therapeutic exercise;Patient and/or family education    Assessment     Goals met: No    Reason Goal(s) Not Met: Patient discharged    Comment: Discharged to [facility] [place] ARU    Goals:  Short Term Goals to be achieved in: 7 days  Pt will perform supine to sit: with stand by assist  Pt will perform sit to stand: with contact guard assist, with cane  Pt will ambulate: 31-50 feet, with cane, with contact guard assist    Continue present treatment plan: No    Discontinue PT at this time (Comment);Pt discharged from hospital    Updated Discharge Recommendations:  Discharge Recommendation: Physical Therapy;Would benefit from intensive therapy upon discharge  Discharge concerns: Requires assistance for mobility;Requires assistance for self care  Discharge Equipment Recommended: Defer to discharge facility;If patient dc home instead of rehab as recommended;Walker    AM-PAC   AM-PAC Basic Mobility Raw Score: 18  AM-PAC Basic Mobility t-Scale Score: 41.05  AM-PAC Basic Mobility CMS 0-100% Score: 40.47 %    Donnita [place] PT, DPT  Phone: [phone]

## 2022-11-18 NOTE — Consults
FINAL DISCHARGE MULTIDISCIPLINARY NOTE  Department of Care Coordination      Admit [id number]  Anticipated Date of Discharge: 11/18/2022    Following UJ:[id number], Marquis Buggy., MD    Home Address:[address] [place] [address]      DISCHARGE INFORMATION:     Per IDR patient remains medically ready for discharge to ARU today.     10:45 AM call placed to Danielle at [facility] and she confirmed that bed will be available after 3 PM. Room # 500; Accepting MD is Dr Regino Schultze; Call RN to RN report to [phone] or [phone].    CM notified Medical Team, bedside RN Chin[place]illa, patient's daughter [name] and [employer].    Ambulance Pick up time confirmed  with Mikle Bosworth for 3 PM today.      Discharge Red packet already on the Unit from yesterday.    CM met with patient at bedside to given update. Patient and family still in agreement with discharge plan to chosen ARU- [facility] in [place].    No other post-acute needs identified at this time.    ADDENDUM:  1:25 PM Current MARs  uploaded to Aid[employer] as requested by Duwayne Heck at [facility]      Discharge Address: [facility] [place] [address] [place], [place] [address]    Individual(s) notified of discharge plan:  Contact Name: Aisa, Schoeppner'' (Daughter) Relationship: Daughter   Contact Number(s): [phone] (Mobile)      Is patient/family informed of discharge?: Yes Is patient/family agreeable of discharge destination?: Yes     Support Systems: Family       Medicare Important Message Provided: Yes      Final Discharge Needs: Facility Transfer/Placement, Transportation Arrangements              Freedom of Choice     Treatment Preferences: Addressed  Discharge Goals: Addressed  Freedom of Choice: Educated and Provided           Aidin Choice List: Provided to Pt/Family  Date Provided: 11/11/22       Facility Transfer/Placement (if applicable):   Accepting Facility - Level of Care: Acute  Type of Acute Facility: Inpatient Rehab Facility  Accepting Facility Name (Required): [facility]  Accepting Facility Address: [address]  [place], [place] [address]  Phone: [phone]  Fax: [phone]  Contact Person: Danielle  Phone Number: Phone: [phone]  Fax Number: Fax: [phone]  Accepting MD: Dr Regino Schultze  Chart Copied?: Yes  Films Copied?: Completed  Physician to Physician Communication Completed: Yes  Bedside Nurse to Accepting Nurse Communication Completed: Yes  Copy of the Interfacility Report given to patient/designee?: Yes      Homeless Discharge (if applicable):   Primary Living Situation: Lives w/Capable Spouse      Transportation Arrangements (if applicable):   Transfer Date: 11/18/22  Time: 1500 (Pick up scheduled for 3 PM)  Transportation Type: Basic life support [employer]video editor Company: [employer]  [address] [place], [place] [address]  Phone Number: Phone: [phone]  Fax: [phone]      Other Arrangements (if applicable):   Other: [facility] [place] [address] [place], [place] [address]  Agency: [employer]  Contact Person: Darden Dates  Phone Number: [phone]  Fax Number: [phone]      Myles Rosenthal, BSN, RN  Clinical Case Manager, Ph# [phone] Pager [phone]    The Department of Care Coordination - Telephone: [phone],  Fax: [phone]    Swing Shift CM (After Business Hours 8AM-5PM Mon thru Fri):   Salome Holmes (Pager [phone], Office: [phone]) or   Juanito Doom (Pager [phone], Office: [phone]: [phone])    If Swing Shift CM unavailable, then contact ED CM (pager [phone], Office: [phone]).     For CM assistance during weekend/holiday-contact covering CM at Pager [phone].      11/18/2022

## 2022-11-18 NOTE — Progress Notes
Pharmaceutical Services - Discharge Medication Reconciliation Note     Patient Name: [name]  Medical Record Number: [id number]  Admit date: 11/04/2022 2:45 AM    Age: 77 y.o.  Sex: female  Allergies: Patient has no known allergies.  Height:   Most recent documented height   11/04/22 1.6 m (5' 3'')     Actual Weight:   Most recent documented weight   11/16/22 62.3 kg     Diagnosis: The patient is currently admitted with the following concerns/issues: Principal Problem:    Brain mass (POA: Yes)      Discharge Medication List:     Changes To My Medications        START taking these medications        Dose Instructions Notes   acetaminophen 325 mg tablet  Commonly known as: Tylenol   650 mg   Take 2 tablets (650 mg total) by mouth every six (6) hours as needed for Pain.      bisacodyl 10 mg suppository  Commonly known as: Dulcolax   10 mg   Place 1 suppository (10 mg total) rectally daily as needed for Constipation.      calcium carbonate 500 mg chewable tablet  Commonly known as: Tums   1,000 mg   Chew 2 tablets (1,000 mg total) by mouth two (2) times daily Calcium Carbonate 500 mg is equivalent to 200 mg elemental Calcium.      * dexAMETHasone 4 mg tablet   4 mg   Take 1 tablet (4 mg total) by mouth three (3) times daily.      * dexAMETHasone 2 mg tablet  Start taking on: November 17, 2022    Take 2 tablets (4 mg total) by mouth three (3) times daily for 2 days, THEN 1 tablet (2 mg total) three (3) times daily for 2 days, THEN 1 tablet (2 mg total) two (2) times daily for 2 days, THEN 1 tablet (2 mg total) daily for 2 days.      docusate 100 mg capsule  Commonly known as: Colace   100 mg   Take 1 capsule (100 mg total) by mouth two (2) times daily.      heparin 5000 unit/mL injection   5,000 Units   Inject 1 mL (5,000 Units total) under the skin two (2) times daily.      hydrOXYzine 25 mg tablet   25 mg   Take 1 tablet (25 mg total) by mouth every six (6) hours as needed for Anxiety.      levETIRAcetam 1000 mg tablet  Commonly known as: Keppra   1,000 mg   Take 1 tablet (1,000 mg total) by mouth two (2) times daily.      melatonin 5 mg tablet   5 mg   Take 1 tablet (5 mg total) by mouth at bedtime.      pantoprazole 40 mg DR tablet  Commonly known as: Protonix  Start taking on: November 18, 2022   40 mg   Take 1 tablet (40 mg total) by mouth daily.      polyethylene glycol powder packet  Commonly known as: Glycolax  Start taking on: November 18, 2022   17 g   Take 1 packet (17 g total) by mouth daily.      sodium phosphate 7-19 g/118 mL ADULT enema   1 enema   Place 1 enema rectally daily as needed.            *  This list has 2 medication(s) that are the same as other medications prescribed for you. Read the directions carefully, and ask your doctor or other care provider to review them with you.                CONTINUE taking these medications        Dose Instructions Notes   amLODIPine 10 mg tablet  Commonly known as: Norvasc   10 mg   Take 1 tablet (10 mg total) by mouth daily.      estradiol 0.5 mg tablet  Commonly known as: Estrace   1 tablet   Take 1 tablet (0.5 mg total) by mouth daily.      GLUCOSAMINE PO   1 capsule   Take 1 capsule by mouth daily.      medroxyPROGESTERone 5 mg tablet  Commonly known as: Provera   1 tablet   Take 1 tablet (5 mg total) by mouth daily.      METAMUCIL FIBER PO    Mix 1 tablespoon in 8 oz of liquid, and take by mouth daily.      naproxen 250 mg tablet  Commonly known as: Naprosyn   250 mg   Take 1 tablet (250 mg total) by mouth daily as needed (pain).      omeprazole 20 mg DR capsule  Commonly known as: PriLOSEC   20 mg   Take 1 capsule (20 mg total) by mouth daily.      vitamin C 1000 MG tablet   1,000 mg   Take 1 tablet (1,000 mg total) by mouth daily.      Vitamin D-3 125 mcg (5000 units) Tabs   1 tablet   Take 1 tablet (125 mcg total) by mouth daily.      vitamin E 180 mg (400 units) capsule   180 mg   Take 1 capsule (180 mg total) by mouth daily.                Prescriptions These medications were sent to [facility] [phone])  [address], [place] [address]      Hours: Mon-Fri 8AM-9PM, Sat 8AM-7PM, Sun/Holidays 8AM-5PM (Closed 1PM-2PM for lunch) Phone: [phone]   dexAMETHasone 4 mg tablet       Information about where to get these medications is not yet available    Ask your nurse or doctor about these medications  acetaminophen 325 mg tablet  bisacodyl 10 mg suppository  calcium carbonate 500 mg chewable tablet  dexAMETHasone 2 mg tablet  docusate 100 mg capsule  heparin 5000 unit/mL injection  hydrOXYzine 25 mg tablet  levETIRAcetam 1000 mg tablet  melatonin 5 mg tablet  pantoprazole 40 mg DR tablet  polyethylene glycol powder packet  sodium phosphate 7-19 g/118 mL ADULT enema         Discharge Prescription Preference:   [facility] [phone]  [address]  [address]  [place] [address]        The reconciliation of discharge orders with inpatient orders and PTA med list is complete. There are no issues requiring follow up at this time.     Patient is getting discharged to a rehab facility for advanced care  Medication Delivery  Discharge prescriptions were  sent to the facility as orders .    Feliberto Gottron, PharmD, 11/17/2022, 5:25 PM

## 2022-11-21 ENCOUNTER — Telehealth: Payer: Commercial Managed Care - Pharmacy Benefit Manager

## 2022-11-21 LAB — FISH: LAB AP MDL CYTOG METHODS: NEGATIVE {titer}

## 2022-11-21 NOTE — Telephone Encounter
Sent an email to [facility] for pending auth

## 2022-11-21 NOTE — Telephone Encounter
Urgent referrals and records faxed to PCP for in network follow up with rad onc & neuro onc. Patient currently in local rehab.

## 2022-11-21 NOTE — Telephone Encounter
Left vm to confirm visit      Future Appointments   Date Time Provider Department Center   11/25/2022  8:15 AM Ellyn Hack., MD [facility] [place]/Cen

## 2022-11-22 ENCOUNTER — Telehealth: Payer: Commercial Managed Care - Pharmacy Benefit Manager

## 2022-11-22 NOTE — Telephone Encounter
Patient's daughter called to inform that patient c/o dizziness, slurred speech, and was transferred from rehab to [facility] ED. Reportedly, she is alert & oriented, and CT was negative. She has a post op appt. with Dr. Allena Katz later this week. Dr. Allena Katz updated. ED [phone] (difficult to connect with any OSH ED staff)

## 2022-11-23 NOTE — Telephone Encounter
Reached out to Dr. Allena Katz to advise of pending auth

## 2022-11-23 NOTE — Telephone Encounter
Dr. Allena Katz ok'ed visit

## 2022-11-23 NOTE — Telephone Encounter
Left vm to confirm appt. Advise visit wont be billed

## 2022-11-24 ENCOUNTER — Telehealth: Payer: Commercial Managed Care - Pharmacy Benefit Manager

## 2022-11-24 NOTE — Telephone Encounter
Patient discharged from OSH and back in rehab. Referrals pending, and patient has her NS and PCP follow up tomorrow.

## 2022-11-25 ENCOUNTER — Telehealth: Payer: Commercial Managed Care - Pharmacy Benefit Manager

## 2022-11-25 DIAGNOSIS — D496 Neoplasm of unspecified behavior of brain: Secondary | ICD-10-CM

## 2022-11-25 NOTE — Progress Notes
[facility]  Phone: [phone]  Fax: [phone]  Department of Neurosurgery. [facility]  [address], [address]  [place], [place] [address]    PATIENT: [name]       MRN: [id number]       DOB: 12-14-1945  DATE OF SERVICE: 11/25/2022   TELEMEDICINE [x]      REFERRING PRACTITIONER: Ellyn Hack., MD  PRIMARY CARE PROVIDER: Lawanna Kobus, MD  VISIT DIAGNOSIS:   1. Brain tumor (HCC/RAF)        HISTORY OF PRESENT ILLNESS:   [name] is a 77 y.o. female presenting postop from GBM rsection    NEUROIMAGING:  Post-operative imaging shows: [x]  GTR []  STR and []  No complication    NEUROLOGICAL EXAM    Mental status:  Alert and oriented to person, place, and time  Language:  Naming 3/3, repetition intact        Visual exam:  Visual fields full to confrontation  Cranial nerve exam:  CN 2-12 grossly intact        Motor exam:  Motor 5/5 throughout without pronator drift  Reflexes:  2+ deep tendon reflexes throughout, no pathological reflexes        Sensory exam:  Sensation to light touch and pain intact  Gait:  No ataxia or gait abnormalities        Karnofsky:  100  Previous Incisions  No issues          ASSESSMENT AND PLAN   [name] is a 77 y.o. female postop from crani for tumor. Had postop EDH evacuation.    Pathology: []  None []  Pending [x]  Discussed pathology  [facility]  Post-operative issues: []  None []  Wound issue [x]  Neurologic issue (aphasia has improved to baseline)  Post-operative medications to continue: []  Off all medications [x]  Dexamethasone [x]  AED []  Antibiotic  Post-operative follow-up imaging: []  CT wo contrast [x]  MRI w/wo contrast  Neurosurgical follow up: []  1 month [x]  3 month []  6 month []  12 month []  PRN  Follow up referrals: []  PCP [x]  Neuro-oncology []  Medical-oncology [x]  Radiation oncology []  Neurology []  Endocrinology []  Physical therapy    I answered all questions the patient had regarding this information. A consensus decision was to pursue to above treatment plan.    Kearney Evitt S. Allena Katz  Assistant Professor  Department of Neurosurgery  [employer]  [employer] [place] [place]  [phone]

## 2022-11-28 LAB — Tissue Exam

## 2022-11-29 ENCOUNTER — Telehealth: Payer: Commercial Managed Care - Pharmacy Benefit Manager

## 2022-11-29 NOTE — Telephone Encounter
Lizette with [employer] clearance dept stated the auth as been approved and she will go head and link it to the appt.    Lizette [phone] ext [phone]    Thank you  Joyice Faster

## 2022-11-29 NOTE — Telephone Encounter
Reason/Request:  Pt daughter Nashaly is requesting a call back if a sooner appt becomes available.     Caller Name and Relationship to PT: Lyndel-daughter    Callback #: [phone]    /Carenet

## 2022-11-29 NOTE — Telephone Encounter
Unable to schedule in timeframe recommended. Please contact patient daughter [name] [phone] for sooner appointment    New Patient Scheduled.  Date and time of appointment: 12/12/22-8am  Physician Diagnosis: Brain Tumor   [place] Patient or(For Non-[place] Physician)   Referring Physician name and number   (if available): Emeline Darling  Please send new patient packet.  Email: [email or web address]  Caller name and relation to the patient: Daughter-Grace  Char/Carenet

## 2022-12-01 NOTE — Telephone Encounter
Hello Team,    I called referring provider's office and spoke with Darel Hong to update auth. Waiting on a call back to know that the request is done to complete pt appt.

## 2022-12-02 ENCOUNTER — Telehealth: Payer: Commercial Managed Care - Pharmacy Benefit Manager

## 2022-12-02 NOTE — Telephone Encounter
[name] saw her in network rad onc provider today. Dr. Micheal Likens appt pending.  Rad Onc  Dr. Cecilie Lowers  [phone]

## 2022-12-06 NOTE — Telephone Encounter
Please resend new pt packet.    Email: [email or web address]       Thanks Ronaldo Crilly

## 2022-12-12 ENCOUNTER — Ambulatory Visit: Payer: Commercial Managed Care - Pharmacy Benefit Manager | Attending: Neurology

## 2022-12-12 DIAGNOSIS — C719 Malignant neoplasm of brain, unspecified: Secondary | ICD-10-CM

## 2022-12-12 DIAGNOSIS — Z5111 Encounter for antineoplastic chemotherapy: Secondary | ICD-10-CM

## 2022-12-12 DIAGNOSIS — Z2989 Seizure prophylaxis: Secondary | ICD-10-CM

## 2022-12-12 MED ORDER — TEMOZOLOMIDE 20 MG PO CAPS
20 mg | ORAL_CAPSULE | Freq: Every evening | ORAL | 0 refills | 21.00000 days | Status: AC
Start: 2022-12-12 — End: ?
  Filled 2022-12-20 (×2): qty 21, 21d supply, fill #0

## 2022-12-12 MED ORDER — SULFAMETHOXAZOLE-TRIMETHOPRIM DOUBLE STRENGTH 800-160 MG PO TABS
1 | ORAL_TABLET | ORAL | 3 refills | Status: AC
Start: 2022-12-12 — End: ?

## 2022-12-12 MED ORDER — POLYETHYLENE GLYCOL 3350 17 GM/SCOOP PO POWD
17 g | Freq: Every day | ORAL | 3 refills | Status: AC | PRN
Start: 2022-12-12 — End: ?

## 2022-12-12 MED ORDER — TEMOZOLOMIDE 5 MG PO CAPS
5 mg | ORAL_CAPSULE | Freq: Every evening | ORAL | 0 refills | 21.00 days | Status: AC
Start: 2022-12-12 — End: ?
  Filled 2022-12-20 (×2): qty 21, 21d supply, fill #0

## 2022-12-12 MED ORDER — ONDANSETRON HCL 8 MG PO TABS
8 mg | ORAL_TABLET | Freq: Three times a day (TID) | ORAL | 3 refills | 8.00000 days | Status: AC | PRN
Start: 2022-12-12 — End: ?

## 2022-12-12 MED ORDER — TEMOZOLOMIDE 100 MG PO CAPS
100 mg | ORAL_CAPSULE | Freq: Every evening | ORAL | 0 refills | 21.00000 days | Status: AC
Start: 2022-12-12 — End: ?
  Filled 2022-12-20 (×2): qty 21, 21d supply, fill #0

## 2022-12-12 MED FILL — TEMOZOLOMIDE 5 MG PO CAPS: 5 mg | ORAL | 21 days supply | Qty: 21 | Fill #0

## 2022-12-12 MED FILL — TEMOZOLOMIDE 100 MG PO CAPS: 100 mg | ORAL | 21 days supply | Qty: 21 | Fill #0

## 2022-12-12 MED FILL — TEMOZOLOMIDE 20 MG PO CAPS: 20 mg | ORAL | 21 days supply | Qty: 21 | Fill #0

## 2022-12-12 NOTE — Consults
[facility]  [facility] NEURO-ONCOLOGY INITIAL CONSULTATION    DATE OF SERVICE: 12/12/2022    REFERRING PROVIDER: Dr. Emeline Chambers  PRIMARY CARE PROVIDER: Lawanna Kobus, MD  PROVIDER: Elana Alm. Micheal Likens, MD, PhD    REASON FOR CONSULTATION:   1. Glioblastoma (HCC/RAF)    2. Encounter for chemotherapy management        HISTORY OF PRESENT ILLNESS      [name] is a 77 y.o.  female, right-handed, who is seen in consultation at the Neuro-Oncology Clinic at [facility] for glioblastoma treatment options and clinical review.  This patient is accompanied in the office by her  daughter, [name] .    The patient presented to [facility] on 10/26/22 with word finding difficulties for 2 weeks when speaking, [place] (first language), [place], and [place]. CT head demonstrated a lesion in her left anterior temporal lobe and was transferred to [facility] for HLOC. She underwent left temporal craniotomy with GTR by Dr. Emeline Chambers on 11/09/22 that was complicated by an epidural hematoma that was evacuated on 11/14/22 by Dr. Emeline Chambers. Pathology demonstrated Glioblastoma, WHO Grade, 4, methylated, EGFR amp. 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     Since her discharge, she has been doing well at home. Her daughter states she currently has some difficulty with word-finding in [place] and comprehension. They recently saw a local radiation oncologist, Dr. Cecilie Chambers [phone]) in [place]. She is using a walker for ambulating due to feeling more fatigued and weak since her discharge. She is on Keppra 1000mg  BID but feels it makes her fatigue worse. Weaned off steroids.     She denies headaches, seizures, extremity weakness, vision changes, paraesthesias/numbness.         BRAIN TUMOR DIAGNOSIS    [x] GLIOMA [] MENINGIOMA [] METS[] OTHER[] unk    Neuropathology:  Glioblastoma WHO grade: IV, [id number]  Location: left anterior temporal  Recurrence?:  [x] New  [] Recurrent Molecular studies:  MGMT Gene Methylation: methylated  1p/19q codeletion by FISH: not done  EGFR amplification by FISH: positive  NGS:  pending     BRAIN/NERVOUS SYSTEM TUMOR TREATMENT HISTORY SUMMARY  NEW DX    11/09/22 Left temporal craniotomy with gross total resection of left temporal lesion by Dr. Emeline Chambers at [facility]    Pathology at that time was consistent with Glioblastoma, WHO Grade 4   11/14/22 Left Craniotomy for epidural hematoma evacuation by Dr. Emeline Chambers at [facility]   Pending Radiation therapy, type pending, to left temporal lesion,  for dosage of pending, performed by Dr. Cecilie Chambers   Pending started Chemotherapy TMZ, concurrent with radiation therapy, dose at 75mg /m2 125 mg                NEURO-ONC RELATED ADVERSE EVENT HISTORY  SEIZURES - None  DVTs/PE - None  TREATMENT-RELATED COMPLICATIONS - None    AEDs: Keppra 1000mg  BID, decreased to 500mg  BID on 12/12/22  STEROIDS: None     Current medications:  Current Outpatient Medications   Medication Sig    amLODIPine 10 mg tablet Take 1 tablet (10 mg total) by mouth daily.    Ascorbic Acid (VITAMIN C) 1000 MG tablet Take 1 tablet (1,000 mg total) by mouth daily.    Cholecalciferol (VITAMIN D-3) 125 mcg (5000 units) TABS Take 1 tablet (125 mcg total) by mouth daily.    docusate 100 mg capsule Take 1  capsule (100 mg total) by mouth two (2) times daily.    Glucosamine HCl (GLUCOSAMINE PO) Take 1 capsule by mouth daily.    levETIRAcetam 1000 mg tablet Take 1 tablet (1,000 mg total) by mouth two (2) times daily.    ondansetron (ZOFRAN) 8 mg tablet Take 1 tablet (8 mg total) by mouth every eight (8) hours as needed for Nausea or Vomiting And Take 1 tab 1 hour before chemo.    pantoprazole 40 mg DR tablet Take 1 tablet (40 mg total) by mouth daily.    polyethylene glycol powder Take 17 g by mouth daily as needed (constipation) Dissolve 17gm in 4-8oz of water.    sulfamethoxazole-trimethoprim DOUBLE strength 800-160 mg tablet Take 1 tablet by mouth every Monday, Wednesday, Friday at 9 am for 30 doses.    temozolomide 100 mg capsule Take 1 capsule (100 mg total) by mouth at bedtime with 2 other temozolomide prescriptions for 125 mg total for 21 days.    temozolomide 20 mg capsule Take 1 capsule (20 mg total) by mouth at bedtime with 2 other temozolomide prescriptions for 125 mg total for 21 days.    temozolomide 5 mg capsule Take 1 capsule (5 mg total) by mouth at bedtime with 2 other temozolomide prescriptions for 125 mg total for 21 days.    [DISCONTINUED] acetaminophen 325 mg tablet Take 2 tablets (650 mg total) by mouth every six (6) hours as needed for Pain.    [DISCONTINUED] bisacodyl 10 mg suppository Place 1 suppository (10 mg total) rectally daily as needed for Constipation.    [DISCONTINUED] calcium carbonate 500 mg chewable tablet Chew 2 tablets (1,000 mg total) by mouth two (2) times daily Calcium Carbonate 500 mg is equivalent to 200 mg elemental Calcium.    [DISCONTINUED] estradiol (ESTRACE) 0.5 mg tablet Take 1 tablet (0.5 mg total) by mouth daily.    [DISCONTINUED] heparin 5000 unit/mL injection Inject 1 mL (5,000 Units total) under the skin two (2) times daily. (Patient not taking: Reported on 11/25/2022.)    [DISCONTINUED] hydrOXYzine 25 mg tablet Take 1 tablet (25 mg total) by mouth every six (6) hours as needed for Anxiety.    [DISCONTINUED] medroxyPROGESTERone 5 mg tablet Take 1 tablet (5 mg total) by mouth daily.    [DISCONTINUED] melatonin 5 mg tablet Take 1 tablet (5 mg total) by mouth at bedtime.    [DISCONTINUED] METAMUCIL FIBER PO Mix 1 tablespoon in 8 oz of liquid, and take by mouth daily.    [DISCONTINUED] naproxen 250 mg tablet Take 1 tablet (250 mg total) by mouth daily as needed (pain).    [DISCONTINUED] omeprazole 20 mg DR capsule Take 1 capsule (20 mg total) by mouth daily.    [DISCONTINUED] polyethylene glycol powder packet Take 1 packet (17 g total) by mouth daily. [DISCONTINUED] sodium phosphate (FLEET) 7-19 g/118 mL ADULT enema Place 1 enema rectally daily as needed.    [DISCONTINUED] vitamin E 180 mg (400 units) capsule Take 1 capsule (180 mg total) by mouth daily.     No current facility-administered medications for this visit.        Allergies:  No Known Allergies    Review of Systems:  A 14-system review of systems was performed and is negative except as stated in the history of present illness.    Past Medical History:  Past Medical History:   Diagnosis Date    Arthritis     Hypertension        Past Surgical History:  Past Surgical History:   Procedure Laterality Date    FRACTURE SURGERY      JOINT REPLACEMENT         Social History:  Social History     Tobacco Use    Smoking status: Never    Smokeless tobacco: Never   Substance Use Topics    Alcohol use: Never     Retired since 72    Family History:  No family history on file.  Brother- Lung CA  Maternal Aunt- Brain CA, uncertain what kind, lived in [place], [place]        Exams:      Vitals:    BP 143/80  ~ Pulse 80  ~ Temp 36.3 ?C (97.4 ?F) (Forehead)  ~ Resp 16  ~ Ht 160 cm (5' 3'')  ~ Wt 64 kg (141 lb)  ~ LMP  (LMP Unknown)  ~ SpO2 98%  ~ BMI 24.98 kg/m?     General: alert, appears stated age   Skin: No rash   HEENT: Craniotomy scar well healed. Eyes clear sclera/conjunctiva.  No Oropharyngeal lesions   Lungs:  breathing comfortably on RA   Abdomen:  soft   Extremities:  No clubbing cyanosis or edema     Neurologic exam:      Mental status:  Alert and oriented to person, place and time.  Attention is intact. Mild difficulty with repetition.  Recent memory intact, recalled 3/3 words immediately and 1/3 words at 5 minutes, 3/3 withclues.  Mild word finding difficulties. Able to name, pen, phone. Able to follow 3 step commands that cross midline. Mild difficulty with comprehension.    Cranial nerves: II: Visual field full   III, IV, VI: extraocular muscles motions intact  V: facial light touch sensation normal bilaterally  VII: facial muscle function normal bilaterally  VIII: hearing normal  IX: soft palate elevation  normal in midline  IX, X: gag reflex intact  XI: trapezius sternocleidomastoid strength normal bilaterally  XII: tongue strength intact   Sensory:  normal to light touch   Motor: Grossly non-focal and antigravity x4   Coordination: Intact FTN   Reflexes: 2+   Gait:  Steady, narrow based gait with walker. Able to walk forwards and backwards.     KPS: 80    Imaging Studies:  We reviewed her pre and immediate post operative 11/09/22 MRI imaging at [facility] which demonstrated gross total resection with some fluid/blood collection noted subjacent to the craniotomy flap, later evacuated.  MR brain wo+w contrast    Result Date: 11/11/2022  IMPRESSION: 1.  Status post left sided craniotomy and gross total resection of left temporal lobe mass. 2.  Postsurgical epidural collection subjacent to the craniotomy flap exerts mild mass effect on the left cerebral hemisphere. There is approximately 4 mm rightward shift of the septum pellucidum. 3.  Trace left parieto-occipital subdural hemorrhage as well as scattered small volume subarachnoid hemorrhage. Cipriano Bunker M.D., reviewed the study and agree with the above findings. Signed by: Angelique Holm   11/11/2022 11:19 AM     Pathology:          Recent Labs:  Lab Results   Component Value Date    WBC 11.36 (H) 11/18/2022    HGB 9.3 (L) 11/18/2022    HCT 27.8 (L) 11/18/2022    PLT 153 11/18/2022     Lab Results   Component Value Date    NA 136 11/18/2022    K 4.0 11/18/2022    CL  104 11/18/2022    CO2 23 11/18/2022    CREAT 0.55 (L) 11/18/2022    BUN 21 11/18/2022    GLUCOSE 106 (H) 11/18/2022    CALCIUM 9.0 11/18/2022     Lab Results   Component Value Date    MG 1.8 11/10/2022    PHOS 1.5 (L) 11/10/2022     Lab Results   Component Value Date    TOTPRO 5.8 (L) 11/04/2022    ALBUMIN 3.5 (L) 11/04/2022    BILITOT 0.4 11/04/2022    AST 10 (L) 11/04/2022    ALT 24 11/04/2022 ALKPHOS 67 11/04/2022     No results found for: ''GGT'', ''AMYLASE'', ''LIPASE'', ''AMMONIA'', ''LDH''  Lab Results   Component Value Date    PT 15.1 (H) 11/10/2022    APTT <24.0 (L) 11/10/2022    INR 1.2 11/10/2022       Assessment:   TAURI ETHINGTON is a 77 y.o. female.  This patient has a newly diagnosed Glioblastoma, WHO Grade 4, unmethylated, EGFR amp, located in the left Temporal lobe.  In summary, the patient treatments include craniotomy with GTR.    Clinically, she is stable. She has recovered well from her craniotomy and hematoma evacuation with minimal neuro deficits. She does endorse feeling fatigued and uses a walker for assistance since her discharge. She does notice she is more fatigue with Keppra 1000mg  so we will decrease her Keppra to 500mg  BID given she has not had any seizures. We educated patient and daugther that her seizures may present as episodes of expressive aphasia, abnormal sensation auras (vivid deja vu, stomach rising/nausesa, auditory/visual hallucinations) and to contact us if patient develops. She continues to have mild word-finding and comprehension difficulties. Neuro exam notable for short term memory and comprehension issues; questions and commands had to be repeated multiple times.     We reviewed her pathology which demonstrated a glioblastoma, WHO Grade 4, unmethylated with EGFR amplification. We recommend proceeding with next generation sequencing with [employer] that can help identify any specific mutations present that can be targeted with treatment therapies and patient is in agreement. Our data coordinators will be contacted and consent to be emailed to patient's daughter for review. We independently reviewed her pre and post operative imaging with demonstrated craniotomy with gross total resection.     Overall, we discussed the natural history of GBM, as well as the standard of care going forward, including chemoradiation followed by 6 cycles of adjuvant Temodar (5 days on, 23 days off). We discussed the side effects of Temodar include but are not limited to nausea, vomiting, constipation, bone marrow suppression, fatigue. We will premedicate with Zofran 8mg  1 hour prior to Temodar and then every 8 hours PRN nausea and vomiting.  We will perform a baseline CBC with plt/diff and CMP.  We will perform weekly CBC with plt/diff to monitor for myelosuppression.  For constipation, we recommend miralax 1 tablespoon daily PRN.  We will prescribe Bactrim DS take 1 tablet 3x/week on MWF for PCP prevention.  We will dose his Temodar at 75mg /m2 to start Day 1 of concurrent radiation and Temodar. We did discuss doing a hypofractioned course of RT + TMZ of 3 weeks instead of the 6 to avoid toxicities and the patient and daughter are in agreement. We will contact Dr. Seymour Bars to update him on our plan and recommendations. We educated patient to flush twice when using a shared toilet due to TMZ being excreted in stool and urine.   Since  patient lives in [place], she wishes to have treatment close to home. She will obtain local labs at her clinic, [facility] and her daughter will find a local imaging center for her to receive MRIs. For now, she can receive MRIs from Rad[facility] Dr. Seymour Bars and once RT is completed, we will have our care coordinators send patient outside MRI orders. Patient's daughter was advised to contact us once they have a start date and we will send an updated chemo calendar with dates.     Plans/Recommendations:   #Glioblastoma  -Pending start of Temodar @ 125mg  daily x 21 days (=75mg /m2), to be coordinated with outside [facility]   -Zofran 1 hour before Temodar, every 8 hours as needed after   -Miralax PRN   -Bactrim MWF during chemoradiation   -Baseline CBC with diff and CMP, then labs weekly  -F/u with local RadOnc Dr. Cecilie Chambers as scheduled  -Decrese Keppra to 500mg  BID   -Advised to keep seizure diary   -Educated on seizures S/S  -Family to email neuro-onc team once they have RT start date  - Will obtain NGS to evaluate for specific mutations or hypermethylator phenotype   -We will request [employer]?CDx (F1CDx) qualitative next generation sequencing test on the patient's tumor tissues. [employer] CDx has national coverage for qualifying Medicare and Medicare Advantage patients across all solid tumors. The test is FDA approved as a companion diagnostic to identify patients with Solid Tumors who may benefit from treatment with targeted therapies, including testing for TMB (Tumor mutation burden >/=10 for PD1 inhibitors) or NTRK1/2/3 fusions (for NTRK inhibitors). Brain tumors are also known to harbor mutations seen in Melanoma including BRAF and may also benefit from treatment with BRAF inhibitors.    Return to clinic:  RTC 4 weeks for VV    Future Appointments (if any - see below for last 10 appointments)  No future appointments.      Attending Physician: Jeanette Alm. Micheal Likens, MD, PhD.  Author: Howie Ill, NP    ADDENDUM     I performed a substantive portion of an E/M visit face-to-face with patient Maylene Roes on the same date of service with Sherrine Maples, NP.  I was personally involved in reviewing and conducting elements of the history, physical exam, adverse events, laboratory studies, and medical decision making, and agree with the above findings and plan of care documented in the NP's note along with my additions and/or corrections.      Genoa Freyre A. Micheal Likens, MD, PhD   [phone]

## 2022-12-14 ENCOUNTER — Telehealth: Payer: Commercial Managed Care - Pharmacy Benefit Manager

## 2022-12-14 NOTE — Telephone Encounter
Pt's daughter [name] is requesting a call back, would like to confirm whether or not chemo pill for pt will be delivered to the home address as per clinical notes. Please contact to advise.    Best contact # [phone]  [name]- Daughter            [name]

## 2022-12-14 NOTE — Addendum Note
Addended by: Nichola Sizer on: 12/14/2022 04:47 PM     Modules accepted: Orders

## 2022-12-29 ENCOUNTER — Ambulatory Visit: Payer: Commercial Managed Care - Pharmacy Benefit Manager

## 2022-12-29 ENCOUNTER — Telehealth: Payer: Commercial Managed Care - Pharmacy Benefit Manager

## 2022-12-29 MED ORDER — PANTOPRAZOLE SODIUM 40 MG PO TBEC
40 mg | ORAL_TABLET | Freq: Every day | ORAL | 3 refills | Status: AC
Start: 2022-12-29 — End: ?

## 2022-12-29 NOTE — Telephone Encounter
The pt's daughter is requesting to reschedule the zoom appt on 06/06 to a different date. The pt's daughter is requesting either 06/04, 06/05 or the following week any day except Tuesday the 11th of June.    Rebecah [phone]    Thank you  Joyice Faster

## 2022-12-29 NOTE — Telephone Encounter
Patient has been rescheduled and confirmed appt with daughter

## 2023-01-09 ENCOUNTER — Ambulatory Visit: Payer: Commercial Managed Care - Pharmacy Benefit Manager | Attending: Neurology

## 2023-01-12 ENCOUNTER — Ambulatory Visit: Payer: Commercial Managed Care - Pharmacy Benefit Manager | Attending: Neurology

## 2023-01-16 ENCOUNTER — Telehealth: Payer: Commercial Managed Care - Pharmacy Benefit Manager | Attending: Neurology

## 2023-01-16 DIAGNOSIS — Z5111 Encounter for antineoplastic chemotherapy: Secondary | ICD-10-CM

## 2023-01-16 DIAGNOSIS — Z2989 Seizure prophylaxis: Secondary | ICD-10-CM

## 2023-01-16 DIAGNOSIS — C719 Malignant neoplasm of brain, unspecified: Secondary | ICD-10-CM

## 2023-01-16 MED ORDER — TEMOZOLOMIDE 250 MG PO CAPS
250 mg | ORAL_CAPSULE | Freq: Every day | ORAL | 0 refills | 5.00 days | Status: AC
Start: 2023-01-16 — End: ?
  Filled 2023-01-20: qty 5, 5d supply, fill #0

## 2023-01-16 MED ORDER — TEMOZOLOMIDE 5 MG PO CAPS
5 mg | ORAL_CAPSULE | Freq: Every day | ORAL | 0 refills | 5.00000 days | Status: AC
Start: 2023-01-16 — End: ?
  Filled 2023-01-20: qty 5, 5d supply, fill #0

## 2023-01-16 MED ORDER — LEVETIRACETAM 500 MG PO TABS
500 mg | ORAL_TABLET | Freq: Two times a day (BID) | ORAL | 3 refills | Status: AC
Start: 2023-01-16 — End: ?

## 2023-01-16 NOTE — Progress Notes
[facility] BRAIN TUMOR CENTER   NEURO-ONCOLOGY FOLLOW-UP NOTE    DATE OF SERVICE: 01/16/2023    REFERRING PROVIDER: Dr. Emeline Darling  PRIMARY CARE PROVIDER: Lawanna Kobus, MD  PROVIDER: Elana Alm. Micheal Likens, MD, PhD    REASON FOR CONSULTATION:   1. Glioblastoma (HCC/RAF)    2. Seizure prophylaxis    3. Encounter for chemotherapy management      HISTORY OF PRESENT ILLNESS      [name] is a 77 y.o.  female, right-handed, who is seen in consultation at the Neuro-Oncology Clinic at [facility] for glioblastoma treatment options and clinical review.  This patient is accompanied in the office by her  daughter, [name] .    The patient presented to [facility] ER on 10/26/22 with word finding difficulties for 2 weeks when speaking, [place] (first language), [place], and [place]. CT head demonstrated a lesion in her left anterior temporal lobe and was transferred to [facility] for HLOC. She underwent left temporal craniotomy with GTR by Dr. Emeline Darling on 11/09/22 that was complicated by an epidural hematoma that was evacuated on 11/14/22 by Dr. Emeline Darling. Pathology demonstrated Glioblastoma, WHO Grade, 4, methylated, EGFR amp. 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         BRAIN TUMOR DIAGNOSIS    [x] GLIOMA [] MENINGIOMA [] METS[] OTHER[] unk    Neuropathology:  Glioblastoma WHO grade: IV, AVW:[id number]  Location: left  anterior temporal  Recurrence?:  [x] New  [] Recurrent Molecular studies:  MGMT Gene Methylation: methylated  1p/19q codeletion by FISH: not done  EGFR amplification by FISH: positive  NGS:  pending     BRAIN/NERVOUS SYSTEM TUMOR TREATMENT HISTORY SUMMARY  NEW DX    11/09/22 Left temporal craniotomy with gross total resection of left temporal lesion by Dr. Emeline Darling at [facility]    Pathology at that time was consistent with Glioblastoma, WHO Grade 4   11/14/22 Left Craniotomy for epidural hematoma evacuation by Dr. Emeline Darling at [facility]   12/21/22 - 01/11/23 Radiation therapy, type pending, to left temporal lesion,  for dosage of pending, performed by Dr. Cecilie Lowers   12/21/22 - 01/11/23 started Chemotherapy TMZ, concurrent with radiation therapy, dose at 75mg /m2 125 mg    Adjuvant TMZ    Cycle 1: pending 255mg  x5 days (=150mg /m2)            NEURO-ONC RELATED ADVERSE EVENT HISTORY  SEIZURES - None  DVTs/PE - None  TREATMENT-RELATED COMPLICATIONS - None    AEDs: Keppra 500mg  BID  STEROIDS: None    INTERVAL HISTORY  Last seen: 12/12/22    Since her last visit, she completed chemoradation from 12/21/22-01/11/23 with Dr. Cecilie Lowers in [place]. She reports she tolerated it well with mild nausea relieved by Zofran, mild headaches, fatigue, and hair loss to RT site. She reports ongoing difficulty with word finding, worse in [place] and [place] than [place]. Her daughter, [name], has noticed some short term memory issues. She remains seizure free on Keppra 500mg  BID but reports the morning dose makes her sleepy and wishes to stop taking it. Denies history of seizure and was put on Keppra prophylactic post-operatively. Denies issues with balance and is using a cane to ambulate. Recently received new glasses.     Her daughter previously emailed [place] a picture of her incision site which had a scab near it, no s/s infection.     She is having  issues with her insurance authorizing post-radiation MRI and future visits with [facility]. [name] will call her mother's PCP to figure out next steps in obtaining proper authorization.      She denies headaches, seizures, extremity weakness, vision changes, paraesthesias/numbness      Current Outpatient Medications   Medication Sig    amLODIPine 10 mg tablet Take 1 tablet (10 mg total) by mouth daily.    Ascorbic Acid (VITAMIN C) 1000 MG tablet Take 1 tablet (1,000 mg total) by mouth daily.    Cholecalciferol (VITAMIN D-3) 125 mcg (5000 units) TABS Take 1 tablet (125 mcg total) by mouth daily. docusate 100 mg capsule Take 1 capsule (100 mg total) by mouth two (2) times daily.    Glucosamine HCl (GLUCOSAMINE PO) Take 1 capsule by mouth daily.    levETIRAcetam 500 mg tablet Take 1 tablet (500 mg total) by mouth two (2) times daily.    ondansetron (ZOFRAN) 8 mg tablet Take 1 tablet (8 mg total) by mouth every eight (8) hours as needed for Nausea or Vomiting And Take 1 tab 1 hour before chemo.    pantoprazole 40 mg DR tablet Take 1 tablet (40 mg total) by mouth daily.    polyethylene glycol powder Take 17 g by mouth daily as needed (constipation) Dissolve 17gm in 4-8oz of water.    sulfamethoxazole-trimethoprim DOUBLE strength 800-160 mg tablet Take 1 tablet by mouth every Monday, Wednesday, Friday at 9 am for 30 doses.    temozolomide 250 mg capsule Take 1 capsule (250 mg total) by mouth daily with 1 other temozolomide prescription for 255 mg total for 5 days.    temozolomide 5 mg capsule Take 1 capsule (5 mg total) by mouth daily with 1 other temozolomide prescription for 255 mg total for 5 days.    [DISCONTINUED] levETIRAcetam 1000 mg tablet Take 1 tablet (1,000 mg total) by mouth two (2) times daily.     No current facility-administered medications for this visit.        Allergies:  No Known Allergies    Review of Systems:  A 14-system review of systems was performed and is negative except as stated in the history of present illness.    Past Medical History:  Past Medical History:   Diagnosis Date    Arthritis     Hypertension        Past Surgical History:  Past Surgical History:   Procedure Laterality Date    FRACTURE SURGERY      JOINT REPLACEMENT         Social History:  Social History     Tobacco Use    Smoking status: Never    Smokeless tobacco: Never   Substance Use Topics    Alcohol use: Never     Retired since 58    Family History:  No family history on file.  Brother- Lung CA  Maternal Aunt- Brain CA, uncertain what kind, lived in [place], [place]        Exams:      Vitals:    There were no vitals taken for this visit. Exam performed via telemedicine, vital signs not obtained.     General: alert, appears stated age   Skin: No rash   HEENT: Craniotomy scar well healed. Eyes clear sclera/conjunctiva.  No Oropharyngeal lesions   Lungs:  breathing comfortably on RA   Abdomen:  soft   Extremities:  No clubbing cyanosis or edema     Neurologic exam:  Mental status:  Alert and oriented to person, place and time.  Attention is intact. Mild difficulty with repetition.  Recent memory intact, recalled 3/3 words immediately and 1/3 words at 5 minutes, 3/3 withclues.  Mild word finding difficulties. Able to name, pen, watch, pinky finger. Able to follow 3 step commands that cross midline. M   Cranial nerves: II: Visual field full   III, IV, VI: extraocular muscles motions intact  V: facial light touch sensation normal bilaterally  VII: facial muscle function normal bilaterally  VIII: hearing normal  IX: soft palate elevation  normal in midline  IX, X: gag reflex deferred  XI: trapezius sternocleidomastoid strength normal bilaterally  XII: tongue strength deferred   Sensory:  normal to light touch   Motor: Grossly non-focal and antigravity x4   Coordination: Intact FTN   Reflexes: 2+   Gait:  Steady, narrow based gait with cane. Able to walk forwards and backwards.     KPS: 80    Imaging Studies:  No new imaging to review    MR brain wo+w contrast    Result Date: 11/11/2022  IMPRESSION: 1.  Status post left sided craniotomy and gross total resection of left temporal lobe mass. 2.  Postsurgical epidural collection subjacent to the craniotomy flap exerts mild mass effect on the left cerebral hemisphere. There is approximately 4 mm rightward shift of the septum pellucidum. 3.  Trace left parieto-occipital subdural hemorrhage as well as scattered small volume subarachnoid hemorrhage. [name] M.D., reviewed the study and agree with the above findings. Signed by: Angelique Holm   11/11/2022 11:19 AM Pathology:          Recent Labs:  Lab Results   Component Value Date    WBC 11.36 (H) 11/18/2022    HGB 9.3 (L) 11/18/2022    HCT 27.8 (L) 11/18/2022    PLT 153 11/18/2022     Lab Results   Component Value Date    NA 136 11/18/2022    K 4.0 11/18/2022    CL 104 11/18/2022    CO2 23 11/18/2022    CREAT 0.55 (L) 11/18/2022    BUN 21 11/18/2022    GLUCOSE 106 (H) 11/18/2022    CALCIUM 9.0 11/18/2022     Lab Results   Component Value Date    MG 1.8 11/10/2022    PHOS 1.5 (L) 11/10/2022     Lab Results   Component Value Date    TOTPRO 5.8 (L) 11/04/2022    ALBUMIN 3.5 (L) 11/04/2022    BILITOT 0.4 11/04/2022    AST 10 (L) 11/04/2022    ALT 24 11/04/2022    ALKPHOS 67 11/04/2022     No results found for: ''GGT'', ''AMYLASE'', ''LIPASE'', ''AMMONIA'', ''LDH''  Lab Results   Component Value Date    PT 15.1 (H) 11/10/2022    APTT <24.0 (L) 11/10/2022    INR 1.2 11/10/2022       Assessment:   LASHAI GROSCH is a 77 y.o. female.  This patient has a newly diagnosed Glioblastoma, WHO Grade 4, unmethylated, EGFR amp, located in the left Temporal lobe.  In summary, the patient treatments include craniotomy with GTR.    Clinically, she is stable and tolerated radiation therapy with concurrent temozolomide well. She recently completed a hypo fractioned course x3 weeks on 01/11/23. Reports mild nausea, fatigue, and headaches during radiation therapy. Continues to have wordfinding difficulties in [place], [place], and [place] with some short tem memory issues. Neuro exam notable  for word finding difficulties. Denies seizures and wishes to wean off Keppra d/t sleepiness. We discussed she could stop Keppra but would advise against driving at this time since she has no history of seizures. Low threshold to re-initiate Keppra if she develops seizures and discussed what seizures could look like (vivid deja vu, stomach rising/nausesa, auditory/visual hallucinations). If the scab near her incision worsens we will refer her to neurosurgery NP for wound check. Recent lab work during RT WNL. There is no new pathology or imaging to review.     Overall, we discussed the natural history of GBM, as well as the standard of care going forward, including chemoradiation followed by 6 cycles of adjuvant Temodar (5 days on, 23 days off). We discussed the side effects of Temodar include but are not limited to nausea, vomiting, constipation, bone marrow suppression, fatigue. We will premedicate with Zofran 8mg  1 hour prior to Temodar and then every 8 hours PRN nausea and vomiting.  We will perform a baseline CBC with plt/diff and CMP.  We will perform weekly CBC with plt/diff to monitor for myelosuppression.  For constipation, we recommend miralax 1 tablespoon daily PRN.  We will prescribe Bactrim DS take 1 tablet 3x/week on MWF for PCP prevention.  We will dose his Temodar at 75mg /m2 to start Day 1 of concurrent radiation and Temodar.    She is having issues obtaining a baseline MRI s/p RT and her daughter will call her mother's PCP for MRI authorization (we recommended repeat MRI 2-3 weeks after finishing RT). We do not want to delay treatment and we will initiate Cycle 1 without MRI given patient's MGMT positive methylation status. We will follow up in 2 weeks with her for VV and outside MRI imaging if available and will give approval to start dosing at the visit and review Cycle 1 chemo calendar. We will consider weaning off Keppra after next MRI.      Plans/Recommendations:   #Glioblastoma  -Pending start of Cycle 1 Temodar @ 255mg  daily x 5 days (=150mg /m2)   -Zofran 1 hour before Temodar, every 8 hours as needed after   -Miralax PRN   -Bactrim MWF during chemoradiation   -Baseline CBC with diff and CMP, then labs weekly  -F/u with local RadOnc Dr. Cecilie Lowers as scheduled  -Continue Keppra to 500mg  at night   -Advised to keep seizure diary   -Educated on seizures S/S   -Consider weaning off after next MRI  -Stop pantoprazole, resume omeprazole  - Will obtain NGS to evaluate for specific mutations or hypermethylator phenotype   -We will request [employer]?CDx (F1CDx) qualitative next generation sequencing test on the patient's tumor tissues. [employer] CDx has national coverage for qualifying Medicare and Medicare Advantage patients across all solid tumors. The test is FDA approved as a companion diagnostic to identify patients with Solid Tumors who may benefit from treatment with targeted therapies, including testing for TMB (Tumor mutation burden >/=10 for PD1 inhibitors) or NTRK1/2/3 fusions (for NTRK inhibitors). Brain tumors are also known to harbor mutations seen in Melanoma including BRAF and may also benefit from treatment with BRAF inhibitors.  #Wound check  -Refer to neurosurgery NP for wound check if s/s infection, dehiscence  Return to clinic:  RTC 2 weeks for VV and outside MRI review    Future Appointments (if any - see below for last 10 appointments)  No future appointments.        Attending Physician: Elana Alm. Micheal Likens, MD, PhD.  Author: Desiree Hane.  Dalene Carrow, NP    30 minutes were spent personally by me, Myriam Forehand, NP, today on this encounter which include today's pre-visit review of the chart, obtaining appropriate history, performing an evaluation, documentation and discussion of management with details supported within the note for today's visit. The time documented was exclusive of any time spent on the separately billed procedure.    ADDENDUM   12 minutes were spent personally by me today on this encounter which include today's pre-visit review of the chart, obtaining appropriate history, performing an evaluation, documentation and discussion of management with details supported within the note for today's visit. The time documented was exclusive of any time spent on the separately billed procedure.    I performed a substantive portion of an E/M visit face-to-face with patient GALI RUNION on the same date of service with Sherrine Maples, NP.  I was personally involved in reviewing and conducting elements of the history, physical exam, adverse events, laboratory studies, and medical decision making, and agree with the above findings and plan of care documented in the NP's note along with my additions and/or corrections.      Oluwadamilola Deliz A. Micheal Likens, MD, PhD   [phone]

## 2023-01-17 NOTE — Addendum Note
Addended by: Nichola Sizer on: 01/17/2023 02:56 PM     Modules accepted: Orders

## 2023-01-19 NOTE — Telephone Encounter
Spoke to patient will e-mail Dr. Micheal Likens with an overbook request.

## 2023-01-19 NOTE — Telephone Encounter
Reason for call/Ordering Provider: Patient daughter is calling to schedule a Zoom follow up with Dr.after 7/3 patient had Mri  Caller Name/Relationship: Daughter-Judy  Call Back Number:  Char/Carenet

## 2023-01-19 NOTE — Telephone Encounter
Reason for call/Ordering Provider: Patient daughter is calling to schedule a Zoom follow up with Dr.after 7/3 patient have an Mri.  Caller Name/Relationship: Daughter-Judy  Call Back Number: [phone]  Char/Carenet

## 2023-01-27 ENCOUNTER — Ambulatory Visit: Payer: Commercial Managed Care - Pharmacy Benefit Manager

## 2023-01-28 MED ORDER — FLUOXETINE HCL 20 MG PO CAPS
20 mg | ORAL_CAPSULE | Freq: Every day | ORAL | 0 refills | Status: AC
Start: 2023-01-28 — End: ?

## 2023-01-28 NOTE — Progress Notes
Patient reporting some increased anxiety regarding her diagnosis.  Discussed the opportunity to consider Prozac 20mg  for dual effect of anti-anxiety, as well as potential in treatment of EGFR altered GBM.  Will start with low dose at 20mg  daily and monitor for side effects.  Risks/benefits/alternatives discussed and patient is in agreement.    Domonic Hiscox A. Micheal Likens, MD, PhD   [phone]

## 2023-02-06 ENCOUNTER — Telehealth: Payer: Commercial Managed Care - Pharmacy Benefit Manager

## 2023-02-06 NOTE — Telephone Encounter
Reason/Request: Patient's mom [name]  is requesting a call back to reschedule follow up appointment with neuro oncology Dr. Micheal Likens.     Caller Name and Relationship to PT: Grace/ daughter     Juliann Pulse #: [phone]     Patty/Carenet

## 2023-02-07 NOTE — Telephone Encounter
Patient has been rescheduled.

## 2023-02-08 ENCOUNTER — Inpatient Hospital Stay: Payer: Commercial Managed Care - Pharmacy Benefit Manager | Attending: Neurology

## 2023-02-08 DIAGNOSIS — C719 Malignant neoplasm of brain, unspecified: Secondary | ICD-10-CM

## 2023-02-08 MED ADMIN — GADOBUTROL 1 MMOL/ML IV SOLN: 7.5 mL | INTRAVENOUS | @ 21:00:00 | Stop: 2023-02-08 | NDC 65219028107

## 2023-02-15 NOTE — Progress Notes
[facility] BRAIN TUMOR CENTER  [place] NEURO-ONCOLOGY FOLLOW-UP NOTE    DATE OF SERVICE: 02/16/2023    REFERRING PROVIDER: Dr. Emeline Darling  PRIMARY CARE PROVIDER: Lawanna Kobus, MD  PROVIDER: Elana Alm. Micheal Likens, MD, PhD    REASON FOR CONSULTATION:   1. Glioblastoma (HCC/RAF)    2. Encounter for chemotherapy management        HISTORY OF PRESENT ILLNESS      MAKENZI SCOTLAND is a 77 y.o.  female, right-handed, who is seen in consultation at the Neuro-Oncology Clinic at [facility] for glioblastoma treatment options and clinical review.  This patient is accompanied in the office by her  daughter, [name] .    The patient presented to [facility] ER on 10/26/22 with word finding difficulties for 2 weeks when speaking, [place] (first language), [place], and [place]. CT head demonstrated a lesion in her left anterior temporal lobe and was transferred to [facility] for HLOC. She underwent left temporal craniotomy with GTR by Dr. Emeline Darling on 11/09/22 that was complicated by an epidural hematoma that was evacuated on 11/14/22 by Dr. Emeline Darling. Pathology demonstrated Glioblastoma, WHO Grade, 4, methylated, EGFR amp. 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         BRAIN TUMOR DIAGNOSIS    [x] GLIOMA [] MENINGIOMA [] METS[] OTHER[] unk    Neuropathology:  Glioblastoma WHO grade: IV, [id number]  Location: left  anterior temporal  Recurrence?:  [x] New  [] Recurrent Molecular studies:  MGMT Gene Methylation: methylated  1p/19q codeletion by FISH: not done  EGFR amplification by FISH: positive  NGS:          BRAIN/NERVOUS SYSTEM TUMOR TREATMENT HISTORY SUMMARY  NEW DX    11/09/22 Left temporal craniotomy with gross total resection of left temporal lesion by Dr. Emeline Darling at [facility]    Pathology at that time was consistent with Glioblastoma, WHO Grade 4   11/14/22 Left Craniotomy for epidural hematoma evacuation by Dr. Emeline Darling at [facility]   12/21/22 - 01/11/23 Radiation therapy, type unknown, to left temporal lesion,  for dosage of 30Gy, performed by Dr. Cecilie Lowers   12/21/22 - 01/11/23 started Chemotherapy TMZ, concurrent with radiation therapy, dose at 75mg /m2 125 mg   02/16/23 - ongoing Adjuvant TMZ    02/16/23 - Cycle 1 @ 150mg /m2 = 255mg  x5 days            NEURO-ONC RELATED ADVERSE EVENT HISTORY  SEIZURES - None  DVTs/PE - None  TREATMENT-RELATED COMPLICATIONS - None    AEDs: Keppra 500mg  at night  STEROIDS: None    INTERVAL HISTORY  Last seen: 12/12/22    Since her last visit, she and her daughter, [name], report she is clinically stable. She developed anxiety relating to her diagnosis and treatment and was started on Prozac 20mg  daily. Unfortunately, Prozac made her dizzy and and was unable to tolerate the side effects. She has an appointment with her PCP on 03/02/23 to discuss other anxiety treatment options. Continues to have language and short term memory issues but have not worsened since her last visit.  Recently followed up with her RadOnc Dr. Seymour Bars and will see him again in 4 months. Denies seizures and is compliant with Keppra 500mg  at bedtime. She is inquiring when she can drive again.     She has a long-standing history of back pain and is scheduled for an epidural injection on 02/22/23.  She denies headaches, seizures, extremity weakness, vision changes, paraesthesias/numbness      Current Outpatient Medications   Medication Sig    amLODIPine 10 mg tablet Take 1 tablet (10 mg total) by mouth daily.    Ascorbic Acid (VITAMIN C) 1000 MG tablet Take 1 tablet (1,000 mg total) by mouth daily.    Cholecalciferol (VITAMIN D-3) 125 mcg (5000 units) TABS Take 1 tablet (125 mcg total) by mouth daily.    docusate 100 mg capsule Take 1 capsule (100 mg total) by mouth two (2) times daily.    FLUoxetine 20 mg capsule Take 1 capsule (20 mg total) by mouth daily.    Glucosamine HCl (GLUCOSAMINE PO) Take 1 capsule by mouth daily.    levETIRAcetam 500 mg tablet Take 1 tablet (500 mg total) by mouth two (2) times daily.    ondansetron (ZOFRAN) 8 mg tablet Take 1 tablet (8 mg total) by mouth every eight (8) hours as needed for Nausea or Vomiting And Take 1 tab 1 hour before chemo.    pantoprazole 40 mg DR tablet Take 1 tablet (40 mg total) by mouth daily.    polyethylene glycol powder Take 17 g by mouth daily as needed (constipation) Dissolve 17gm in 4-8oz of water.    sulfamethoxazole-trimethoprim DOUBLE strength 800-160 mg tablet Take 1 tablet by mouth every Monday, Wednesday, Friday at 9 am for 30 doses.     No current facility-administered medications for this visit.        Allergies:  No Known Allergies    Review of Systems:  A 14-system review of systems was performed and is negative except as stated in the history of present illness.    Past Medical History:  Past Medical History:   Diagnosis Date    Arthritis     Hypertension        Past Surgical History:  Past Surgical History:   Procedure Laterality Date    FRACTURE SURGERY      JOINT REPLACEMENT         Social History:  Social History     Tobacco Use    Smoking status: Never    Smokeless tobacco: Never   Substance Use Topics    Alcohol use: Never     Retired since 53    Family History:  No family history on file.  Brother- Lung CA  Maternal Aunt- Brain CA, uncertain what kind, lived in [place], [place]        Exams:      Vitals:    There were no vitals taken for this visit. Exam performed via telemedicine, vital signs not obtained.     General: alert, appears stated age   Skin: No rash   HEENT: Craniotomy scar well healed. Eyes clear sclera/conjunctiva.  No Oropharyngeal lesions   Lungs:  breathing comfortably on RA   Abdomen:  soft   Extremities:  No clubbing cyanosis or edema     Neurologic exam:      Mental status:  Alert and oriented to person, place and time.  Attention is intact. Mild difficulty with repetition.  Recent memory intact, recalled 3/3 words immediately and 3/3 words at 5 minutes in [place]. Mild word finding difficulties. Able to name, pen and fingernail in [place], unable to name thumb. Able to follow 3 step commands that cross midline. M   Cranial nerves: II: Visual field full   III, IV, VI: extraocular muscles motions intact  V: facial light touch sensation normal bilaterally  VII:  facial muscle function normal bilaterally  VIII: hearing normal  IX: soft palate elevation  normal in midline  IX, X: gag reflex deferred  XI: trapezius sternocleidomastoid strength normal bilaterally  XII: tongue strength deferred   Sensory:  normal to light touch   Motor: Grossly non-focal and antigravity x4   Coordination: Intact FTN   Reflexes: 2+   Gait:  Steady, narrow based gait with cane. Able to walk forwards and backwards.     KPS: 80    Imaging Studies:  MRI Brain 02/08/23 was independently reviewed by Dr. Micheal Likens and compared to previous MRI from 11/11/22 and agree with impression below.     MR brain wo+w contrast    Result Date: 02/08/2023  IMPRESSION: 1.  Expected interval evolution postsurgical changes from left temporal glioblastoma resection and extra-axial hematoma evacuation. Mildly increased enhancement at the periphery of the resection cavity compared to 11/11/2022, though this could be in part related to differences in magnet strength. Mildly increased surrounding T2/FLAIR hyperintensity with questionable mildly expansile component in the insula to the subinsular region. No definite associated elevated relative cerebral blood volume within the  limits of this exam. Continued attention to follow-up recommended. [id number] Signed by: Lajuana Matte   02/08/2023 3:14 PM    MR brain wo+w contrast    Result Date: 11/11/2022  IMPRESSION: 1.  Status post left sided craniotomy and gross total resection of left temporal lobe mass. 2.  Postsurgical epidural collection subjacent to the craniotomy flap exerts mild mass effect on the left cerebral hemisphere. There is approximately 4 mm rightward shift of the septum pellucidum. 3.  Trace left parieto-occipital subdural hemorrhage as well as scattered small volume subarachnoid hemorrhage. Cipriano Bunker M.D., reviewed the study and agree with the above findings. Signed by: Angelique Holm   11/11/2022 11:19 AM     Pathology:          Recent Labs:  Lab Results   Component Value Date    WBC 11.36 (H) 11/18/2022    HGB 9.3 (L) 11/18/2022    HCT 27.8 (L) 11/18/2022    PLT 153 11/18/2022     Lab Results   Component Value Date    NA 136 11/18/2022    K 4.0 11/18/2022    CL 104 11/18/2022    CO2 23 11/18/2022    CREAT 0.55 (L) 11/18/2022    BUN 21 11/18/2022    GLUCOSE 106 (H) 11/18/2022    CALCIUM 9.0 11/18/2022     Lab Results   Component Value Date    MG 1.8 11/10/2022    PHOS 1.5 (L) 11/10/2022     Lab Results   Component Value Date    TOTPRO 5.8 (L) 11/04/2022    ALBUMIN 3.5 (L) 11/04/2022    BILITOT 0.4 11/04/2022    AST 10 (L) 11/04/2022    ALT 24 11/04/2022    ALKPHOS 67 11/04/2022     No results found for: ''GGT'', ''AMYLASE'', ''LIPASE'', ''AMMONIA'', ''LDH''  Lab Results   Component Value Date    PT 15.1 (H) 11/10/2022    APTT <24.0 (L) 11/10/2022    INR 1.2 11/10/2022       Assessment:   MARLETH MCKENDALL is a 77 y.o. female.  This patient has a newly diagnosed Glioblastoma, WHO Grade 4, unmethylated, EGFR amp, located in the left Temporal lobe.  In summary, the patient treatments include craniotomy with GTR.    Clinically, she is stable and continues to have language and short  term memory issues. She developed anxiety and was prescribed Prozac by our team but was unable to tolerate the side effects; she will follow up with her PCP for management. Neuro exam notable for word finding difficulties and naming issues. Denies interval seizures, compliant with her Keppra 500mg  at bedtime. In regards to driving, we advised she would need to be on Keppra 500mg  BID. If she is compliant, she may drive.     We reviewed her most recent imaging from 02/07/22 that demonstrated expected post-surgical changes with some enhancement surrounding the resection cavity, c/w treatment effect. Her most recent labs obtained by her PCP clinic are stable and WNL.     Overall, we discussed the natural history of GBM, as well as the standard of care going forward, including chemoradiation followed by 6 cycles of adjuvant Temodar (5 days on, 23 days off). We discussed the side effects of Temodar include but are not limited to nausea, vomiting, constipation, bone marrow suppression, fatigue. We will premedicate with Zofran 8mg  1 hour prior to Temodar and then every 8 hours PRN nausea and vomiting.  We will perform a baseline CBC with plt/diff and CMP.  We will perform weekly CBC with plt/diff to monitor for myelosuppression.  For constipation, we recommend miralax 1 tablespoon daily PRN. She has completed TMZ + RT and she will now start adjuvant TMZ with Cycle 1 TMZ dosed at 150mg /m2, starting tonight. We reviewed Cycle 1 chemo calendar and patient and daughter verbalized understanding.   Plan is to follow up in 8 weeks for VV and MRI review. Her daughter will try and obtain appointment for outside MRI but d/t limited scheduling availability at local imaging center, we will schedule a MRI at [facility] location. If patient's daughter is able to obtain local MRI, she will cancel the [facility] MRI.  Plans/Recommendations:   #Glioblastoma  -Start of Cycle 1 Temodar @ 255mg  daily x 5 days (=150mg /m2) tonight, 02/16/23   -Zofran 1 hour before Temodar, every 8 hours as needed after   -Miralax PRN   -labs on Day 21 and Day 28   -chemo calendar emailed  -F/u with local RadOnc Dr. Cecilie Lowers as scheduled  -Increase Keppra to 500mg  twice a day   -Advised to keep seizure diary   -Educated on seizures S/S   -Okay to drive if compliant with medications  -Continue omeprazole  -MRI in 8 weeks    Return to clinic:  RTC 8 weeks for VV and outside MRI review    Future Appointments (if any - see below for last 10 appointments)  No future appointments.      Attending Physician: Elana Alm. Micheal Likens, MD, PhD.  Author: Howie Ill, NP    30 minutes were spent personally by me, Myriam Forehand, NP, today on this encounter which include today's pre-visit review of the chart, obtaining appropriate history, performing an evaluation, documentation and discussion of management with details supported within the note for today's visit. The time documented was exclusive of any time spent on the separately billed procedure.    ADDENDUM   15 minutes were spent personally by me today on this encounter which include today's pre-visit review of the chart, obtaining appropriate history, performing an evaluation, documentation and discussion of management with details supported within the note for today's visit. The time documented was exclusive of any time spent on the separately billed procedure.    I performed a substantive portion of an E/M visit face-to-face with patient Jurni  Arletta Bale on the same date of service with Sherrine Maples, NP.  I was personally involved in reviewing and conducting elements of the history, physical exam, adverse events, laboratory studies, and medical decision making, and agree with the above findings and plan of care documented in the NP's note along with my additions and/or corrections.      Dannae Kato A. Micheal Likens, MD, PhD   [phone]

## 2023-02-16 ENCOUNTER — Telehealth: Payer: PRIVATE HEALTH INSURANCE | Attending: Neurology

## 2023-02-16 DIAGNOSIS — C719 Malignant neoplasm of brain, unspecified: Secondary | ICD-10-CM

## 2023-02-23 ENCOUNTER — Ambulatory Visit: Payer: Commercial Managed Care - Pharmacy Benefit Manager

## 2023-02-23 DIAGNOSIS — C719 Malignant neoplasm of brain, unspecified: Secondary | ICD-10-CM

## 2023-02-23 DIAGNOSIS — Z5111 Encounter for antineoplastic chemotherapy: Secondary | ICD-10-CM

## 2023-02-23 MED ORDER — AMOXICILLIN 500 MG PO CAPS
2000 mg | ORAL_CAPSULE | Freq: Once | ORAL | 0 refills | Status: AC
Start: 2023-02-23 — End: ?

## 2023-02-27 ENCOUNTER — Telehealth: Payer: Commercial Managed Care - Pharmacy Benefit Manager

## 2023-02-27 NOTE — Telephone Encounter
Spoke to patient's daughter regarding authorization will not be requested until closer to the appointment date.

## 2023-02-27 NOTE — Telephone Encounter
Reason/Request: Patient's daughter is requested a call back from [facility] regarding her mother's upcoming appointments. She wants to know if any referral is still needed for the patient's MRI and if insurance has covered her appointment with Dr. Micheal Likens. Please advise.    Caller Name and Relationship to PT: [name] (daughter)    Callback #: [phone]    [place]

## 2023-03-09 ENCOUNTER — Ambulatory Visit: Payer: PRIVATE HEALTH INSURANCE

## 2023-03-09 DIAGNOSIS — C719 Malignant neoplasm of brain, unspecified: Secondary | ICD-10-CM

## 2023-03-09 MED ORDER — TEMOZOLOMIDE 140 MG PO CAPS
140 mg | ORAL_CAPSULE | Freq: Every day | ORAL | 0 refills | 5 days | Status: AC
Start: 2023-03-09 — End: ?
  Filled 2023-03-14 (×2): qty 5, 5d supply, fill #0

## 2023-03-09 MED ORDER — TEMOZOLOMIDE 100 MG PO CAPS
200 mg | ORAL_CAPSULE | Freq: Every day | ORAL | 0 refills | 5.00 days | Status: AC
Start: 2023-03-09 — End: ?
  Filled 2023-03-14 (×2): qty 10, 5d supply, fill #0

## 2023-03-17 MED ORDER — TEMOZOLOMIDE 140 MG PO CAPS
140 mg | ORAL_CAPSULE | Freq: Every day | ORAL | 0 refills | Status: AC
Start: 2023-03-17 — End: ?

## 2023-04-06 ENCOUNTER — Ambulatory Visit: Payer: Commercial Managed Care - Pharmacy Benefit Manager

## 2023-04-06 DIAGNOSIS — C719 Malignant neoplasm of brain, unspecified: Secondary | ICD-10-CM

## 2023-04-06 MED ORDER — TEMOZOLOMIDE 140 MG PO CAPS
140 mg | ORAL_CAPSULE | Freq: Every day | ORAL | 0 refills | 5.00 days | Status: AC
Start: 2023-04-06 — End: ?
  Filled 2023-04-08: qty 5, 5d supply, fill #0

## 2023-04-06 MED ORDER — TEMOZOLOMIDE 100 MG PO CAPS
200 mg | ORAL_CAPSULE | Freq: Every day | ORAL | 0 refills | 5.00000 days | Status: AC
Start: 2023-04-06 — End: ?
  Filled 2023-04-08: qty 10, 5d supply, fill #0

## 2023-04-12 ENCOUNTER — Ambulatory Visit: Payer: Commercial Managed Care - Pharmacy Benefit Manager

## 2023-04-12 ENCOUNTER — Inpatient Hospital Stay: Payer: Commercial Managed Care - Pharmacy Benefit Manager

## 2023-04-12 DIAGNOSIS — C719 Malignant neoplasm of brain, unspecified: Secondary | ICD-10-CM

## 2023-04-12 MED ADMIN — GADOBUTROL 1 MMOL/ML IV SOLN: 7.5 mL | INTRAVENOUS | @ 21:00:00 | Stop: 2023-04-12 | NDC 65219028107

## 2023-04-13 ENCOUNTER — Telehealth: Payer: Commercial Managed Care - Pharmacy Benefit Manager | Attending: Neurology

## 2023-04-13 NOTE — Progress Notes
[facility] BRAIN TUMOR CENTER  [place] NEURO-ONCOLOGY FOLLOW-UP NOTE    DATE OF SERVICE: 04/13/2023    REFERRING PROVIDER: Dr. Emeline Darling  PRIMARY CARE PROVIDER: Lawanna Kobus, MD  PROVIDER: Elana Alm. Micheal Likens, MD, PhD    REASON FOR CONSULTATION:   1. Glioblastoma (HCC/RAF)    2. Encounter for chemotherapy management    3. Seizure prophylaxis      HISTORY OF PRESENT ILLNESS      [name] is a 77 y.o.  female, right-handed, who is seen in consultation at the Neuro-Oncology Clinic at [facility] for glioblastoma treatment options and clinical review.  This patient is accompanied in the office by her  daughter, [name] .    The patient presented to [facility] on 10/26/22 with word finding difficulties for 2 weeks when speaking, [place] (first language), [place], and [place]. CT head demonstrated a lesion in her left anterior temporal lobe and was transferred to [facility] for HLOC. She underwent left temporal craniotomy with GTR by Dr. Emeline Darling on 11/09/22 that was complicated by an epidural hematoma that was evacuated on 11/14/22 by Dr. Emeline Darling. Pathology demonstrated Glioblastoma, WHO Grade, 4, methylated, EGFR amp. 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   BRAIN TUMOR DIAGNOSIS    [x] GLIOMA [] MENINGIOMA [] METS[] OTHER[] unk    Neuropathology:  Glioblastoma WHO grade: IV, [id number]  Location: left  anterior temporal  Recurrence?:  [x] New  [] Recurrent Molecular studies:  MGMT Gene Methylation: methylated  1p/19q codeletion by FISH: not done  EGFR amplification by FISH: positive  NGS:          BRAIN/NERVOUS SYSTEM TUMOR TREATMENT HISTORY SUMMARY  NEW DX    11/09/22 Left temporal craniotomy with gross total resection of left temporal lesion by Dr. Emeline Darling at [facility]    Pathology at that time was consistent with Glioblastoma, WHO Grade 4   11/14/22 Left Craniotomy for epidural hematoma evacuation by Dr. Emeline Darling at [facility]   12/21/22 - 01/11/23 Radiation therapy, type unknown, to left temporal lesion,  for dosage of 30Gy, performed by Dr. Cecilie Lowers   12/21/22 - 01/11/23 started Chemotherapy TMZ, concurrent with radiation therapy, dose at 75mg /m2 125 mg   02/16/23 - ongoing Adjuvant TMZ    02/16/23 - Cycle 1 @ 150mg /m2 = 255mg  x5 days  03/16/23 - Cyce 2 @ 200mg /m2 = 340mg  x 5 days  04/13/23 - Cycle 3 @200mg /m2 = 340mg  x 5 days            NEURO-ONC RELATED ADVERSE EVENT HISTORY  SEIZURES - None  DVTs/PE - None  TREATMENT-RELATED COMPLICATIONS - None    AEDs: Keppra 500mg  at night  STEROIDS: None    INTERVAL HISTORY  Last seen: 02/16/23    Since last visit, patient completed adjuvant tmz cycle 2.  She endorses fatigue and low appetitie. Denies nausea. States she is able to eat all of her meals, but has very low appetite which causes he some anxiety.  No nausea no vomiting.  Denies seizures. Stable on Keppra 500mg  BID.     Continues to have some language and short term memory issues, but stable from previous.     She has a long-standing history of back pain and is scheduled for an epidural injection on 02/22/23.      She denies headaches, seizures, extremity weakness, vision changes, paraesthesias/numbness  Current Outpatient Medications   Medication Sig    amLODIPine 10 mg tablet Take 1 tablet (10 mg total) by mouth daily.    Ascorbic Acid (VITAMIN C) 1000 MG tablet Take 1 tablet (1,000 mg total) by mouth daily.    Cholecalciferol (VITAMIN D-3) 125 mcg (5000 units) TABS Take 1 tablet (125 mcg total) by mouth daily.    docusate 100 mg capsule Take 1 capsule (100 mg total) by mouth two (2) times daily.    FLUoxetine 20 mg capsule Take 1 capsule (20 mg total) by mouth daily.    Glucosamine HCl (GLUCOSAMINE PO) Take 1 capsule by mouth daily.    levETIRAcetam 500 mg tablet Take 1 tablet (500 mg total) by mouth two (2) times daily.    ondansetron (ZOFRAN) 8 mg tablet Take 1 tablet (8 mg total) by mouth every eight (8) hours as needed for Nausea or Vomiting And Take 1 tab 1 hour before chemo.    pantoprazole 40 mg DR tablet Take 1 tablet (40 mg total) by mouth daily.    polyethylene glycol powder Take 17 g by mouth daily as needed (constipation) Dissolve 17gm in 4-8oz of water.    [EXPIRED] temozolomide 100 mg capsule Take 2 capsules (200 mg total) by mouth daily with 1 other temozolomide prescription for 340 mg total for 5 days.    [EXPIRED] temozolomide 140 mg capsule Take 1 capsule (140 mg total) by mouth daily with 1 other temozolomide prescription for 340 mg total for 5 days.     No current facility-administered medications for this visit.     Facility-Administered Medications Ordered in Other Visits   Medication Dose Route Frequency    [COMPLETED] gadobutrol (Gadavist) 1 mmol/mL inj 7.5 mL  7.5 mL Intravenous Once        Allergies:  No Known Allergies    Review of Systems:  A 14-system review of systems was performed and is negative except as stated in the history of present illness.    Past Medical History:  Past Medical History:   Diagnosis Date    Arthritis     Hypertension        Past Surgical History:  Past Surgical History:   Procedure Laterality Date    FRACTURE SURGERY      JOINT REPLACEMENT         Social History:  Social History     Tobacco Use    Smoking status: Never    Smokeless tobacco: Never   Substance Use Topics    Alcohol use: Never     Retired since 89    Family History:  No family history on file.  Brother- Lung CA  Maternal Aunt- Brain CA, uncertain what kind, lived in [place], [place]        Exams:      Vitals:    There were no vitals taken for this visit. Exam performed via telemedicine, vital signs not obtained.     General: alert, appears stated age   Skin: No rash   HEENT: Craniotomy scar well healed. Eyes clear sclera/conjunctiva.  No Oropharyngeal lesions   Lungs:  breathing comfortably on RA   Abdomen:  soft   Extremities:  No clubbing cyanosis or edema     Neurologic exam:      Mental status:  Alert and oriented to person, place and time.  Attention is intact. Mild difficulty with repetition.  Recent memory intact, recalled 3/3 words immediately and 3/3 words at 5 minutes in [place].  Mild word finding difficulties. Able to name, pen and fingernail in [place], unable to name thumb. Able to follow 3 step commands that cross midline. M   Cranial nerves: II: Visual field full   III, IV, VI: extraocular muscles motions intact  V: facial light touch sensation normal bilaterally  VII: facial muscle function normal bilaterally  VIII: hearing normal  IX: soft palate elevation  normal in midline  IX, X: gag reflex deferred  XI: trapezius sternocleidomastoid strength normal bilaterally  XII: tongue strength deferred   Sensory:  normal to light touch   Motor: Grossly non-focal and antigravity x4   Coordination: Intact FTN   Reflexes: 2+   Gait:  Steady, narrow based gait with cane. Able to walk forwards and backwards.     KPS: 80    Imaging Studies:  MRI Brain 04/12/23 was independently reviewed by Dr. Micheal Likens and compared to previous MRI which showed expected post-treatment changes with slight enhancement along the resection cavity that likely represent post-treatment changes, especially in setting of MGMT methylated tumors. We will continue to monitor closely.     MR brain wo+w contrast    Result Date: 04/12/2023  IMPRESSION: Status post left temporal craniotomy, tumor resection, and extra-axial hematoma evacuation. Since 02/08/2023, there is mildly increased nodular enhancement along the resection cavity which may reflect tumor progression and/or post treatment changes. Continued attention on follow-up is recommended.. Stable FLAIR hyperintensity surrounding the resection cavity, appearing expansile in the insular and subinsular region. [id number] Valda Lamb, M.D, have reviewed this radiological study personally and I am in full agreement with the findings presented in this report. Dictated by: Doyce Loose   04/12/2023 4:19 PM Signed by: Roxy Manns   04/12/2023 4:40 PM    MR brain wo+w contrast    Result Date: 02/08/2023  IMPRESSION: 1.  Expected interval evolution postsurgical changes from left temporal glioblastoma resection and extra-axial hematoma evacuation. Mildly increased enhancement at the periphery of the resection cavity compared to 11/11/2022, though this could be in part related to differences in magnet strength. Mildly increased surrounding T2/FLAIR hyperintensity with questionable mildly expansile component in the insula to the subinsular region. No definite associated elevated relative cerebral blood volume within the  limits of this exam. Continued attention to follow-up recommended. [id number] Signed by: Lajuana Matte   02/08/2023 3:14 PM    MR brain wo+w contrast    Result Date: 11/11/2022  IMPRESSION: 1.  Status post left sided craniotomy and gross total resection of left temporal lobe mass. 2.  Postsurgical epidural collection subjacent to the craniotomy flap exerts mild mass effect on the left cerebral hemisphere. There is approximately 4 mm rightward shift of the septum pellucidum. 3.  Trace left parieto-occipital subdural hemorrhage as well as scattered small volume subarachnoid hemorrhage. Cipriano Bunker M.D., reviewed the study and agree with the above findings. Signed by: Angelique Holm   11/11/2022 11:19 AM     Pathology:          Recent Labs:  Lab Results   Component Value Date    WBC 11.36 (H) 11/18/2022    HGB 9.3 (L) 11/18/2022    HCT 27.8 (L) 11/18/2022    PLT 153 11/18/2022     Lab Results   Component Value Date    NA 136 11/18/2022    K 4.0 11/18/2022    CL 104 11/18/2022    CO2 23 11/18/2022    CREAT 0.55 (L) 11/18/2022    BUN 21 11/18/2022  GLUCOSE 106 (H) 11/18/2022    CALCIUM 9.0 11/18/2022     Lab Results   Component Value Date    MG 1.8 11/10/2022    PHOS 1.5 (L) 11/10/2022     Lab Results   Component Value Date    TOTPRO 5.8 (L) 11/04/2022    ALBUMIN 3.5 (L) 11/04/2022    BILITOT 0.4 11/04/2022    AST 10 (L) 11/04/2022    ALT 24 11/04/2022    ALKPHOS 67 11/04/2022     No results found for: ''GGT'', ''AMYLASE'', ''LIPASE'', ''AMMONIA'', ''LDH''  Lab Results   Component Value Date    PT 15.1 (H) 11/10/2022    APTT <24.0 (L) 11/10/2022    INR 1.2 11/10/2022       Assessment:   [name] is a 77 y.o. female.  This patient has a newly diagnosed Glioblastoma, WHO Grade 4, unmethylated, EGFR amp, located in the left Temporal lobe.  In summary, the patient treatments include craniotomy with GTR.    Clinically, she is stable and continues to have language and short term memory issues. She has overall fatigue and low appetite, but she is still fully functional at home.  She would like to try to start prozac 20mg  daily again and understands that in order to be effective she must take it everyday. Also, we discussed that it could take a few weeks for any symptom improvement.     Neuro exam notable for word finding difficulties and naming issues. Denies interval seizures, compliant with her Keppra 500mg  at bedtime. In regards to driving, we advised she would need to be on Keppra 500mg  BID. If she is compliant, she may drive.   We reviewed potential symptoms of Seizure could be abnormal sensations, deja vu, visual or auditory hallucinations, stomach rising, intermittent speech arrest, and any thing that affects just the right side of the body. We advised her to let us know if she experiences any of these symptoms or any other new neurologic symptoms.     Her most recent labs obtained by her PCP clinic are stable and WNL.     MRI Brain 04/12/23 was independently reviewed by Dr. Micheal Likens and compared to previous MRI which showed expected post-treatment changes with slight enhancement along the resection cavity that likely represent post-treatment changes, especially in setting of MGMT methylated tumors. We will continue to monitor closely.     Given stable MRI, we recommend continuing with adjuvant temodar cycle 3 and 4.     Plan is to follow up in 8 weeks for VV and MRI review. Her daughter will try and obtain appointment for outside MRI at [facility].      All patient questions answered today and they are in agreement with plan going forward.  The patient has our contact information and has been encouraged to call with any new symptom or problems, questions, concerns, or if there are any changes in his neurological or functional status.       Plans/Recommendations:   #Glioblastoma  -Start Cycle 3 Temodar @ 340mg  daily x 5 days (=200mg /m2) tonight, 04/13/23   -Zofran 1 hour before Temodar, every 8 hours as needed after   -Miralax PRN   -labs on Day 21 and Day 28   -chemo calendar emailed  -F/u with local RadOnc Dr. Cecilie Lowers as scheduled  -Increase Keppra to 500mg  twice a day   -Advised to keep seizure diary   -Educated on seizures S/S   -Okay to drive  if compliant with medications  -Continue omeprazole  - Start Prozac 20mg  QD  -MRI in 8 weeks    Return to clinic:  RTC 8 weeks for VV and outside MRI review    Future Appointments (if any - see below for last 10 appointments)  No future appointments.  RTC VV and MRI 8 weeks (prior to cycle 5 adjuvant tmz)    Attending Physician: Elana Alm. Micheal Likens, MD, PhD.  Author: Hulan Amato, NP    This patient was seen in collaboration with attending physician, Dr. Nichola Sizer.     30 minutes were spent personally by me, Meryl Dare, NP, today on this encounter which include today's pre-visit review of the chart, obtaining appropriate history, performing an evaluation, documentation and discussion of management with details supported within the note for today's visit. The time documented was exclusive of any time spent on the separately billed procedure.    ADDENDUM   10 minutes were spent personally by me today on this encounter which include today's pre-visit review of the chart, obtaining appropriate history, performing an evaluation, documentation and discussion of management with details supported within the note for today's visit. The time documented was exclusive of any time spent on the separately billed procedure.    I performed a substantive portion of an E/M visit face-to-face with patient [name] on the same date of service with Meryl Dare, NP. I was personally involved in reviewing and conducting elements of the history, physical exam, adverse events, laboratory studies, and medical decision making, and agree with the above findings and plan of care documented in the NP's note along with my additions and/or corrections.      Valton Schwartz A. Micheal Likens, MD, PhD   [phone]

## 2023-04-14 DIAGNOSIS — Z5111 Encounter for antineoplastic chemotherapy: Secondary | ICD-10-CM

## 2023-04-14 DIAGNOSIS — C719 Malignant neoplasm of brain, unspecified: Secondary | ICD-10-CM

## 2023-04-14 DIAGNOSIS — Z2989 Seizure prophylaxis: Secondary | ICD-10-CM

## 2023-05-01 ENCOUNTER — Ambulatory Visit: Payer: PRIVATE HEALTH INSURANCE

## 2023-05-02 ENCOUNTER — Telehealth: Payer: PRIVATE HEALTH INSURANCE

## 2023-05-02 NOTE — Telephone Encounter
Call Back Request      Reason for call back: Patient's daughter is calling to inform office 10/04 appt date will not work.   Please assist with scheduling another appt sooner than next available in December.     Any Symptoms:  []  Yes  [x]  No      If yes, what symptoms are you experiencing:    Duration of symptoms (how long):    Have you taken medication for symptoms (OTC or Rx):      If call was taken outside of clinic hours:    [] Patient or caller has been notified that this message was sent outside of normal clinic hours.     [] Patient or caller has been warm transferred to the physician's answering service. If applicable, patient or caller informed to please call us back if symptoms progress.  Patient or caller has been notified of the turnaround time of 1-2 business day(s).

## 2023-05-03 ENCOUNTER — Ambulatory Visit: Payer: PRIVATE HEALTH INSURANCE

## 2023-05-03 NOTE — Telephone Encounter
Sent pt MyChart with new r/s appt details

## 2023-05-04 ENCOUNTER — Ambulatory Visit: Payer: PRIVATE HEALTH INSURANCE

## 2023-05-04 DIAGNOSIS — C719 Malignant neoplasm of brain, unspecified: Secondary | ICD-10-CM

## 2023-05-04 MED ORDER — TEMOZOLOMIDE 140 MG PO CAPS
140 mg | ORAL_CAPSULE | Freq: Every day | ORAL | 0 refills | 5.00000 days | Status: AC
Start: 2023-05-04 — End: ?

## 2023-05-04 MED ORDER — TEMOZOLOMIDE 100 MG PO CAPS
200 mg | ORAL_CAPSULE | Freq: Every day | ORAL | 0 refills | 5.00000 days | Status: AC
Start: 2023-05-04 — End: ?
  Filled 2023-05-09: qty 10, 5d supply, fill #0
  Filled 2023-05-09 (×2): qty 5, 5d supply, fill #0

## 2023-05-05 ENCOUNTER — Ambulatory Visit: Payer: Commercial Managed Care - Pharmacy Benefit Manager

## 2023-05-05 MED ORDER — FLUOXETINE HCL 20 MG PO CAPS
20 mg | ORAL_CAPSULE | Freq: Every day | ORAL | 0 refills | Status: AC
Start: 2023-05-05 — End: ?

## 2023-05-05 MED ORDER — FLUOXETINE HCL 20 MG PO CAPS
40 mg | ORAL_CAPSULE | Freq: Every day | ORAL | 3 refills | Status: AC
Start: 2023-05-05 — End: ?

## 2023-05-08 MED ORDER — FLUOXETINE HCL 20 MG PO CAPS
20 mg | ORAL_CAPSULE | Freq: Every day | ORAL | 0 refills | Status: AC
Start: 2023-05-08 — End: ?

## 2023-05-12 ENCOUNTER — Ambulatory Visit: Payer: Commercial Managed Care - Pharmacy Benefit Manager

## 2023-05-15 ENCOUNTER — Ambulatory Visit: Payer: Commercial Managed Care - Pharmacy Benefit Manager

## 2023-05-17 ENCOUNTER — Telehealth: Payer: Commercial Managed Care - Pharmacy Benefit Manager

## 2023-05-17 NOTE — Telephone Encounter
AUTH PENDING FOR CONSULT      Future Appointments   Date Time Provider Department Center   05/18/2023  2:45 PM Ellyn Hack., MD [facility] [place]   06/08/2023 11:30 AM Rolly Salter., MD, PhD [facility] [place]

## 2023-05-18 ENCOUNTER — Ambulatory Visit: Payer: Commercial Managed Care - Pharmacy Benefit Manager

## 2023-05-19 ENCOUNTER — Ambulatory Visit: Payer: PRIVATE HEALTH INSURANCE

## 2023-05-31 ENCOUNTER — Ambulatory Visit: Payer: PRIVATE HEALTH INSURANCE

## 2023-05-31 DIAGNOSIS — C719 Malignant neoplasm of brain, unspecified: Secondary | ICD-10-CM

## 2023-05-31 MED ORDER — TEMOZOLOMIDE 100 MG PO CAPS
200 mg | ORAL_CAPSULE | Freq: Every day | ORAL | 0 refills | 5 days | Status: AC
Start: 2023-05-31 — End: ?

## 2023-05-31 MED ORDER — TEMOZOLOMIDE 140 MG PO CAPS
140 mg | ORAL_CAPSULE | Freq: Every day | ORAL | 0 refills | 5 days | Status: AC
Start: 2023-05-31 — End: ?
  Filled 2023-06-06: qty 5, 5d supply, fill #0
  Filled 2023-06-06: qty 10, 5d supply, fill #0

## 2023-06-01 MED FILL — TEMOZOLOMIDE 100 MG PO CAPS: 100 mg | ORAL | 5 days supply | Qty: 10 | Fill #0

## 2023-06-01 MED FILL — TEMOZOLOMIDE 140 MG PO CAPS: 140 mg | ORAL | 5 days supply | Qty: 5 | Fill #0

## 2023-06-06 ENCOUNTER — Inpatient Hospital Stay: Payer: Commercial Managed Care - Pharmacy Benefit Manager

## 2023-06-06 DIAGNOSIS — Z719 Counseling, unspecified: Secondary | ICD-10-CM

## 2023-06-08 ENCOUNTER — Telehealth: Payer: Commercial Managed Care - Pharmacy Benefit Manager | Attending: Neurology

## 2023-06-08 DIAGNOSIS — C719 Malignant neoplasm of brain, unspecified: Secondary | ICD-10-CM

## 2023-06-08 NOTE — Progress Notes
Patient Consent to Telehealth   The patient agreed to participate in the video visit prior to joining the visit.       [facility]   NEURO-ONCOLOGY FOLLOW-UP NOTE    DATE OF SERVICE: 06/08/2023    REFERRING PROVIDER: Dr. Emeline Darling  PRIMARY CARE PROVIDER: Lawanna Kobus, MD  PROVIDER: Elana Alm. Micheal Likens, MD, PhD    REASON FOR FOLLOW-UP: Glioblastoma treatment, MRI review    HISTORY OF PRESENT ILLNESS      [name] is a 77 y.o.  female, right-handed, who is seen for follow-up at the Neuro-Oncology Clinic at [facility] for glioblastoma treatment options and clinical review.      The patient presented to [facility] ER on 10/26/22 with word finding difficulties for 2 weeks when speaking, [place] (first language), [place], and [place]. CT head demonstrated a lesion in her left anterior temporal lobe and was transferred to [facility] for HLOC. She underwent left temporal craniotomy with GTR by Dr. Emeline Darling on 11/09/22 that was complicated by an epidural hematoma that was evacuated on 11/14/22 by Dr. Emeline Darling. Pathology demonstrated Glioblastoma, WHO Grade, 4, methylated, EGFR amp. 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     BRAIN TUMOR DIAGNOSIS    [x] GLIOMA [] MENINGIOMA [] METS[] OTHER[] unk    Neuropathology:  Glioblastoma WHO grade: IV, [id number]  Location: left  anterior temporal  Recurrence?:  [x] New  [] Recurrent Molecular studies:  MGMT Gene Methylation: methylated  1p/19q codeletion by FISH: not done  EGFR amplification by FISH: positive  NGS:          BRAIN/NERVOUS SYSTEM TUMOR TREATMENT HISTORY SUMMARY  NEW DX    11/09/22 Left temporal craniotomy with gross total resection of left temporal lesion by Dr. Emeline Darling at [facility]    Pathology at that time was consistent with Glioblastoma, WHO Grade 4   11/14/22 Left Craniotomy for epidural hematoma evacuation by Dr. Emeline Darling at [facility] 12/21/22 - 01/11/23 Radiation therapy, type unknown, to left temporal lesion,  for dosage of 30Gy, performed by Dr. Cecilie Lowers   12/21/22 - 01/11/23 started Chemotherapy TMZ, concurrent with radiation therapy, dose at 75mg /m2 125 mg   02/16/23 - ongoing Adjuvant TMZ    02/16/23 - Cycle 1 @ 150mg /m2 = 255mg  x5 days  03/16/23 - Cyce 2 @ 200mg /m2 = 340mg  x 5 days  04/13/23 - Cycle 3 @200mg /m2 = 340mg  x 5 days  05/11/23 - Cycle 4 @ 200 mg/m2 = 340 mg             NEURO-ONC RELATED ADVERSE EVENT HISTORY  SEIZURES - None  DVTs/PE - None  TREATMENT-RELATED COMPLICATIONS - None    INTERVAL HISTORY  Last seen: 02/16/23  This patient is accompanied by her daughter, [name].    Since last visit, she has been noticing bilateral lower extremity weakness which onset about 1 week ago following a long walk. She recalls that she had walked a long distance which led to her legs feeling weak and needing to sit down. Since then, she has had persisting lower extremity weakness requiring slow cautious gait. She is unsure if this is related to her Temodar treatment.     She continues on adjuvant Temodar Cycle 3 and has been tolerating well otherwise, only noting some fatigue. She feels that she has generalized weakness from the accumulation of chemotherapy treatment. She also endorses lack of appetite  contributing to her fatigue. She reports that prior to her chemotherapy treatment, she began experiencing fatigue in the evenings. She has some speech issues especially difficulty with English which fluctuates. She remains seizure free on Keppra 500 mg BID.     She denies back pain, urinary and bowel incontinence, numbness, or any other focal neurologic deficits.       Current Outpatient Medications   Medication Sig    amLODIPine 10 mg tablet Take 1 tablet (10 mg total) by mouth daily.    Ascorbic Acid (VITAMIN C) 1000 MG tablet Take 1 tablet (1,000 mg total) by mouth daily.    Cholecalciferol (VITAMIN D-3) 125 mcg (5000 units) TABS Take 1 tablet (125 mcg total) by mouth daily.    docusate 100 mg capsule Take 1 capsule (100 mg total) by mouth two (2) times daily.    FLUoxetine 20 mg capsule Take 2 capsules (40 mg total) by mouth daily.    Glucosamine HCl (GLUCOSAMINE PO) Take 1 capsule by mouth daily.    levETIRAcetam 500 mg tablet Take 1 tablet (500 mg total) by mouth two (2) times daily.    ondansetron (ZOFRAN) 8 mg tablet Take 1 tablet (8 mg total) by mouth every eight (8) hours as needed for Nausea or Vomiting And Take 1 tab 1 hour before chemo.    pantoprazole 40 mg DR tablet Take 1 tablet (40 mg total) by mouth daily.    polyethylene glycol powder Take 17 g by mouth daily as needed (constipation) Dissolve 17gm in 4-8oz of water.    temozolomide 100 mg capsule Take 2 capsules (200 mg total) by mouth daily with 1 other temozolomide prescription for 340 mg total for 5 days.    temozolomide 140 mg capsule Take 1 capsule (140 mg total) by mouth daily with 1 other temozolomide prescription for 340 mg total for 5 days.     No current facility-administered medications for this visit.        Allergies:  No Known Allergies    Review of Systems:  A 14-system review of systems was performed and is negative except as stated in the history of present illness.        Exams:      Vitals:    There were no vitals taken for this visit. Exam performed via telemedicine, vital signs not obtained.     General: alert, appears stated age   Skin: No rash   HEENT: Craniotomy scar well healed. Eyes clear sclera/conjunctiva.  No Oropharyngeal lesions   Lungs:  breathing comfortably on RA   Abdomen:  soft   Extremities:  No clubbing cyanosis or edema     Neurologic exam:      Mental status:  Alert and oriented to person, place and time.  Attention is intact. Some difficulty with repetition (although limited by language barrier).  Recent memory intact, recalled 3/3 words immediately and 3/3 words at 5 minutes in [place].  Mild word finding difficulties. Naming intact to telephone, pen, paper and computer mouse. Able to follow 3 step commands that cross midline.    Cranial nerves: II: Visual field full   III, IV, VI: extraocular muscles motions intact  V: facial light touch sensation normal bilaterally  VII: facial muscle function normal bilaterally  VIII: hearing normal  IX: soft palate elevation  normal in midline  IX, X: gag reflex deferred  XI: trapezius sternocleidomastoid strength normal bilaterally  XII: tongue strength deferred   Sensory:  normal to light touch  Motor: Grossly non-focal and antigravity x4   Coordination: Intact FTN and FTF b/l   Reflexes: 2+   Gait:  Steady, narrow based gait with cane. Able to walk forwards and backwards.     KPS: 80    Imaging Studies:  I independently reviewed and interpreted the following imaging and with the patient:     MRI brain w/wo contrast (06/05/2023): I independently interpreted and reviewed the patient's imaging, compared to imaging from 04/12/2023, which demonstrates overall stable to mildly improved exam with possible decrease in enhancement along the resection cavity however can be due to difference in equipment (3T vs 1.5T). No evidence of tumor progression.      Pathology:            Recent Labs:  Labs completed @ [facility]  CBC and differential 06/07/2023  Specimen: Blood  Component  Ref Range & Units 06/07/2023   WBC  3.4 - 10.8 x10E3/uL 6.9   RBC  3.77 - 5.28 x10E6/uL 4.02   Hemoglobin  11.1 - 15.9 g/dL 04.5   Hematocrit  40.9 - 46.6 % 37.7   MCV  79 - 97 fL 94   MCH  26.6 - 33.0 pg 30.1   MCHC  31.5 - 35.7 g/dL 81.1   RDW  91.4 - 78.2 % 19.2 High    Platelets  150 - 450 x10E3/uL 226   NEUTROPHILS  Not Estab. % 49   Lymphs  Not Estab. % 42   Monocytes  Not Estab. % 8   Eos  Not Estab. % 1   Basos  Not Estab. % 0   Neutrophils (Absolute)  1.4 - 7.0 x10E3/uL 3.3   Lymphs (Absolute)  0.7 - 3.1 x10E3/uL 2.9   Monocytes (Absolute)  0.1 - 0.9 x10E3/uL 0.6   Eos (Absolute)  0.0 - 0.4 x10E3/uL 0.1   Baso (Absolute)  0.0 - 0.2 x10E3/uL 0   Immature Granulocytes  Not Estab. % 0   Immature Grans (Abs)  0.0 - 0.1 x10E3/uL 0     Comprehensive metabolic panel (06/07/2023)  Specimen: Blood  Component  Ref Range & Units 06/07/2023   Glucose  70 - 99 mg/dL 97   BUN  8 - 27 mg/dL 24   Creatinine, Ser  9.56 - 1.00 mg/dL 2.13   EGFR  >08 MV/HQI/6.96 64   BUN/Creatinine Ratio  12 - 28 26   Sodium  134 - 144 mmol/L 141   Potassium  3.5 - 5.2 mmol/L 4.3   Chloride  96 - 106 mmol/L 103   CO2  20 - 29 mmol/L 24   Calcium  8.7 - 10.3 mg/dL 29.5 High    Total Protein  6.0 - 8.5 g/dL 6.4   Albumin  3.8 - 4.8 g/dL 4.2   Globulin, Total  1.5 - 4.5 g/dL 2.2   Total Bilirubin  0.0 - 1.2 mg/dL 0.3   Alkaline Phosphatase  44 - 121 IU/L 80   AST  0 - 40 IU/L 18   ALT  0 - 32 IU/L 27       Assessment:   [name] is a 77 y.o. female.  This patient has a newly diagnosed Glioblastoma, WHO Grade 4, unmethylated, EGFR amp, located in the left Temporal lobe.  In summary, the patient treatments include craniotomy with GTR. She started adjuvant Temodar on 02/16/2023 and has completed 4 cycles thus far.      Patient continues on adjuvant Temodar and has been tolerating well  aside from fatigue and lack of appetite. We discussed that fatigue can be expected with Temodar as there can be a cumulative affect with treatment potentially exacerbating side effects. She has some speech issues which can worsen with fatigue as well. She remains seizure free on Keppra 500 mg BID. Neurologic exam today is overall stable.    Review of MRI brain w/wo contrast from 06/05/2023 demonstrates overall stable to mildly improved exam with possible decrease in enhancement along the resection cavity however changes can be due to difference in equipment (3T vs 1.5T). No evidence of tumor progression at this time. Discussed that there are no changes on the scan to explain her new onset of bilateral lower extremity weakness therefore likely related to fatigue and overexertion. We discussed that her tumor has a low likelihood of drop metastasis to the spine however she currently has no symptoms concerning for drop mets therefore advised to continue monitoring. We can order spine imaging evaluate for spinal drop mets if needed.     Plan is to continue on adjuvant Temodar for a goal of 6 total cycles. Review of labs from yesterday are within normal limits to start Cycle 5 of adjuvant Temodar tonight. We will continue to monitor her tumor with repeat MRI brain w/wo contrast in 8 weeks (2 cycles). Patient prefers MRI appointment around 12 @ [facility] location.    She will be changing health insurance starting next year therefore we will have scan completed in 07/2024 prior to insurance change. We will also have our financial assistant reach out to the patient regarding health insurance related questions.     RTC (telemedicine) in 8 weeks with MRI brain and for clinical evaluation.     All patient questions answered today and they are in agreement with plan going forward.  The patient has our contact information and has been encouraged to call with any new symptom or problems, questions, concerns, or if there are any changes in his neurological or functional status.    Plans/Recommendations:   #Glioblastoma  - Plan to initiate Cycle 5 Temodar @ 340mg  daily x 5 days (=200mg /m2) tonight, 06/08/2023   -Zofran 1 hour before Temodar, every 8 hours as needed after   -Miralax PRN   -labs on Day 21 and Day 28   -chemo calendar emailed  - Repeat MRI brain w/wo contrast in 8 weeks  - My office will reach out to answer any questions related to health insurance.    #Seizures  - Continue Keppra 500 mg BID   -Advised to keep seizure diary   -Educated on seizures S/S   -Okay to drive if compliant with medications    -Continue Prozac 20mg  every day    RTC (telemedicine) in 8 weeks with MRI brain and for clinical evaluation.     Future Appointments (if any - see below for last 10 appointments)  Future Appointments   Date Time Provider Department Center   06/08/2023 11:30 AM Rolly Salter., MD, PhD [facility] [place]/Cen   06/09/2023  8:30 AM Ellyn Hack., MD [facility] [place]/Cen     Attending Physician: Elana Alm. Micheal Likens, MD, PhD.  Author: Baltazar Apo    I, Baltazar Apo, Medical Scribe, have assisted attending physician, Elana Alm Micheal Likens, MD, PhD, with the documentation for [name] on 06/08/2023  at 11:59 AM       I, Elana Alm. Micheal Likens, personally saw and examined the patient and made the above plan of care on 06/08/2023. I reviewed  the above documentation and it is both accurate and complete.

## 2023-06-09 ENCOUNTER — Telehealth: Payer: Commercial Managed Care - Pharmacy Benefit Manager

## 2023-06-09 DIAGNOSIS — C719 Malignant neoplasm of brain, unspecified: Secondary | ICD-10-CM

## 2023-06-09 NOTE — Progress Notes
[place] [facility]  Phone: [phone]  Fax: [phone]  Department of Neurosurgery. [facility]  [address]  [place], [place] [address]    PATIENT: JANESHIA GALAS       MRN: [id number]       DOB: 28-Dec-1945  DATE OF SERVICE: 06/09/2023  TELEMEDICINE [x]      REFERRING PRACTITIONER: Ellyn Hack., MD  PRIMARY CARE PROVIDER: Lawanna Kobus, MD  VISIT DIAGNOSIS:   1. Glioblastoma (HCC/RAF)        Dear Dr. Allena Katz, thank you for referring Maylene Roes for neurosurgical consultation.    HISTORY OF PRESENT ILLNESS:   Nakieta Advincula is a 77 y.o. female presenting now 6 months from surgery for glioblastoma.    MEDICATIONS:  Medications that the patient states to be currently taking   Medication Sig    docusate 100 mg capsule Take 1 capsule (100 mg total) by mouth two (2) times daily.    FLUoxetine 20 mg capsule Take 2 capsules (40 mg total) by mouth daily.    levETIRAcetam 500 mg tablet Take 1 tablet (500 mg total) by mouth two (2) times daily.    ondansetron (ZOFRAN) 8 mg tablet Take 1 tablet (8 mg total) by mouth every eight (8) hours as needed for Nausea or Vomiting And Take 1 tab 1 hour before chemo.    pantoprazole 40 mg DR tablet Take 1 tablet (40 mg total) by mouth daily.    polyethylene glycol powder Take 17 g by mouth daily as needed (constipation) Dissolve 17gm in 4-8oz of water.    temozolomide 100 mg capsule Take 2 capsules (200 mg total) by mouth daily with 1 other temozolomide prescription for 340 mg total for 5 days.    temozolomide 140 mg capsule Take 1 capsule (140 mg total) by mouth daily with 1 other temozolomide prescription for 340 mg total for 5 days.       ALLERGIES:   No Known Allergies    PAST MEDICAL HISTORY:  Past Medical History:   Diagnosis Date    Arthritis     Hypertension      PAST SURGICAL HISTORY:  Past Surgical History:   Procedure Laterality Date    FRACTURE SURGERY      JOINT REPLACEMENT        FAMILY HISTORY:   No family history on file.  SOCIAL HISTORY:   Social History     Tobacco Use    Smoking status: Never    Smokeless tobacco: Never   Vaping Use    Vaping status: Never Used   Substance Use Topics    Alcohol use: Never      Intake ROS reviewed    NEUROIMAGING:  I personally have reviewed the imaging. Please see interpretation below.    NEUROLOGICAL EXAM    Mental status:  Alert and oriented to person, place, and time  Language:  Naming 3/3, repetition intact        Visual exam:  Visual fields full to confrontation  Cranial nerve exam:  CN 2-12 grossly intact        Motor exam:  Motor 5/5 throughout without pronator drift  Reflexes:  2+ deep tendon reflexes throughout, no pathological reflexes        Sensory exam:  Sensation to light touch and pain intact  Gait:  No ataxia or gait abnormalities        Karnofsky:  100  Previous Incisions  No issues  ASSESSMENT AND PLAN   Jamiracle Hausner is a 77 y.o. female 6 months from surgery for glioblastoma. She is on TMZ and last MRI looks good. She is back at home, language much better than initial preop. No delayed surgical issues.     I discussed  1) the diagnosis and differential diagnosis  2) potential causes / reasons for this diagnosis  3) short and long term consequences including the untreated natural history  4) treatment options which may include: further diagnostic workup, observation, medical therapy, radiation therapy, and surgery    I answered all questions the patient had regarding this information. A consensus decision was to pursue to below treatment plan.    Continue follow up with Dr. Micheal Likens / completion of TMZ      Dr. Allena Katz and Lawanna Kobus, MD, thank you for your confidence in allowing me to share in the care of your patient.    This patient presented with:  An acute problem causing threat to life/bodily function    I provided care including:  Independent historian participation  Review of previous EMR documentation including primary care and specialist workup  Independent interpretation of neuro-imaging    This patient's care needed  Elective major surgery w/ risk factors     45 minutes were spent personally by me today on this encounter which include today's pre-visit review of the chart, obtaining appropriate history, performing an evaluation, documentation and discussion of management with details supported within the note for today's visit. The time documented was exclusive of any time spent on the separately billed procedure.      Taylormarie Register S. Allena Katz  Assistant Professor  Department of Neurosurgery  [employer]  [place]  [phone]    [facility]  [address] 4th floor  [place] [address]   [phone] (Appointments)  [phone] (Fax)

## 2023-06-11 DIAGNOSIS — Z5111 Encounter for antineoplastic chemotherapy: Secondary | ICD-10-CM

## 2023-06-11 DIAGNOSIS — C719 Malignant neoplasm of brain, unspecified: Secondary | ICD-10-CM

## 2023-06-11 DIAGNOSIS — Z2989 Seizure prophylaxis: Secondary | ICD-10-CM

## 2023-06-15 ENCOUNTER — Ambulatory Visit: Payer: Commercial Managed Care - Pharmacy Benefit Manager

## 2023-06-28 ENCOUNTER — Telehealth: Payer: PRIVATE HEALTH INSURANCE

## 2023-06-28 NOTE — Telephone Encounter
Reason/Request: Lelon Mast from [facility] is requesting to have an order sent for the patient's labs. Please urgently fax order to [phone], patient is in office waiting.    Caller Name and Relationship to PT: Lonell Face #: [phone]     Bri/Carenet

## 2023-07-04 ENCOUNTER — Ambulatory Visit: Payer: Commercial Managed Care - Pharmacy Benefit Manager

## 2023-07-04 DIAGNOSIS — C719 Malignant neoplasm of brain, unspecified: Secondary | ICD-10-CM

## 2023-07-04 MED ORDER — TEMOZOLOMIDE 140 MG PO CAPS
140 mg | ORAL_CAPSULE | Freq: Every day | ORAL | 0 refills | 5.00000 days | Status: AC
Start: 2023-07-04 — End: ?
  Filled 2023-07-12 (×2): qty 10, 5d supply, fill #0
  Filled 2023-07-12 (×2): qty 5, 5d supply, fill #0

## 2023-07-04 MED ORDER — TEMOZOLOMIDE 100 MG PO CAPS
200 mg | ORAL_CAPSULE | Freq: Every day | ORAL | 0 refills | 5.00 days | Status: AC
Start: 2023-07-04 — End: ?

## 2023-07-05 MED FILL — TEMOZOLOMIDE 140 MG PO CAPS: 140 mg | ORAL | 5 days supply | Qty: 5 | Fill #0

## 2023-07-05 MED FILL — TEMOZOLOMIDE 100 MG PO CAPS: 100 mg | ORAL | 5 days supply | Qty: 10 | Fill #0

## 2023-07-21 ENCOUNTER — Ambulatory Visit: Payer: Commercial Managed Care - Pharmacy Benefit Manager | Attending: Neurology

## 2023-07-24 ENCOUNTER — Ambulatory Visit: Payer: PRIVATE HEALTH INSURANCE | Attending: Neurology

## 2023-07-28 ENCOUNTER — Ambulatory Visit: Payer: PRIVATE HEALTH INSURANCE | Attending: Neurology

## 2023-08-05 ENCOUNTER — Inpatient Hospital Stay: Payer: PRIVATE HEALTH INSURANCE | Attending: Neurology

## 2023-08-05 DIAGNOSIS — C719 Malignant neoplasm of brain, unspecified: Secondary | ICD-10-CM

## 2023-08-05 MED ADMIN — GADOBUTROL 1 MMOL/ML IV SOLN: 7.5 mL | INTRAVENOUS | @ 23:00:00 | Stop: 2023-08-05 | NDC 65219028107

## 2023-08-06 ENCOUNTER — Inpatient Hospital Stay: Payer: PRIVATE HEALTH INSURANCE | Attending: Neurology

## 2023-08-06 DIAGNOSIS — C719 Malignant neoplasm of brain, unspecified: Secondary | ICD-10-CM

## 2023-08-10 ENCOUNTER — Ambulatory Visit: Payer: PRIVATE HEALTH INSURANCE | Attending: Neurology

## 2023-08-14 ENCOUNTER — Ambulatory Visit: Payer: PRIVATE HEALTH INSURANCE | Attending: Neurology

## 2023-08-14 ENCOUNTER — Ambulatory Visit: Payer: PRIVATE HEALTH INSURANCE

## 2023-08-14 NOTE — Progress Notes
Note for future MRIs:     Patient would like MRIs done locally in [place].  She needs a faxed MRI order for each MRI:   ''Stat MRI brain with and without contrast''  sent to:     Primary:  Nobie Putnam, MD  phone [phone]  fax [phone]

## 2023-08-21 ENCOUNTER — Ambulatory Visit: Payer: PRIVATE HEALTH INSURANCE | Attending: Neurology

## 2023-08-21 DIAGNOSIS — C719 Malignant neoplasm of brain, unspecified: Secondary | ICD-10-CM

## 2023-08-21 DIAGNOSIS — Z2989 Seizure prophylaxis: Secondary | ICD-10-CM

## 2023-08-21 DIAGNOSIS — G5692 Unspecified mononeuropathy of left upper limb: Secondary | ICD-10-CM

## 2023-08-21 MED ORDER — LEVETIRACETAM ER 500 MG PO TB24
1000 mg | ORAL_TABLET | Freq: Every day | ORAL | 3 refills | Status: AC
Start: 2023-08-21 — End: ?

## 2023-08-21 NOTE — Progress Notes
Patient Consent to Telehealth Questionnaire   Patient agrees to the following.  - I agree  to be treated via a video visit and acknowledge that I may be liable for any relevant copays or coinsurance depending on my insurance plan.  - I understand that this video visit is offered for my convenience and I am able to cancel and reschedule for an in-person appointment if I desire.  - I also acknowledge that sensitive medical information may be discussed during this video visit appointment and that it is my responsibility to locate myself in a location that ensures privacy to my own level of comfort.  - I also acknowledge that I should not be participating in a video visit in a way that could cause danger to myself or to those around me (such as driving or walking).  If my provider is concerned about my safety, I understand that they have the right to terminate the visit.     [facility]   NEURO-ONCOLOGY FOLLOW-UP NOTE    DATE OF SERVICE: 08/21/2023    REFERRING PROVIDER: Dr. Emeline Darling  PRIMARY CARE PROVIDER: Lawanna Kobus, MD  PROVIDER: Elana Alm. Micheal Likens, MD, PhD    REASON FOR FOLLOW-UP: Glioblastoma treatment, MRI review  Encounter Diagnoses   Name Primary?    Glioblastoma (HCC/RAF) Yes    Seizure prophylaxis     Neuropathy, arm, left          HISTORY OF PRESENT ILLNESS      [name] is a 78 y.o.  female, right-handed, who is seen for follow-up at the Neuro-Oncology Clinic at [facility] for glioblastoma treatment options and clinical review.      The patient presented to [facility] ER on 10/26/22 with word finding difficulties for 2 weeks when speaking, [place] (first language), [place], and [place]. CT head demonstrated a lesion in her left anterior temporal lobe and was transferred to [facility] for HLOC. She underwent left temporal craniotomy with GTR by Dr. Emeline Darling on 11/09/22 that was complicated by an epidural hematoma that was evacuated on 11/14/22 by Dr. Emeline Darling. Pathology demonstrated Glioblastoma, WHO Grade, 4, methylated, EGFR amp. 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     BRAIN TUMOR DIAGNOSIS    [x] GLIOMA [] MENINGIOMA [] METS[] OTHER[] unk    Neuropathology:  Glioblastoma WHO grade: IV, AVW:[id number]  Location: left  anterior temporal  Recurrence?:  [x] New  [] Recurrent Molecular studies:  MGMT Gene Methylation: methylated  1p/19q codeletion by FISH: not done  EGFR amplification by FISH: positive  NGS:          BRAIN/NERVOUS SYSTEM TUMOR TREATMENT HISTORY SUMMARY  NEW DX    11/09/22 Left temporal craniotomy with gross total resection of left temporal lesion by Dr. Emeline Darling at [facility]    Pathology at that time was consistent with Glioblastoma, WHO Grade 4   11/14/22 Left Craniotomy for epidural hematoma evacuation by Dr. Emeline Darling at [facility]   12/21/22 - 01/11/23 Radiation therapy, type unknown, to left temporal lesion,  for dosage of 30Gy, performed by Dr. Cecilie Lowers   12/21/22 - 01/11/23 started Chemotherapy TMZ, concurrent with radiation therapy, dose at 75mg /m2 125 mg   02/16/23 - 07/16/23 Adjuvant TMZ  02/16/23 - Cycle 1 @ 150mg /m2 = 255mg  x5 days  03/16/23 - Cyce 2 @ 200mg /m2 = 340mg  x 5 days  04/13/23 - Cycle 3 @200mg /m2 = 340mg   x 5 days  05/11/23 - Cycle 4 @ 200 mg/m2 = 340 mg x 5 days  06/08/23 - Cycle 5 @200mg /m2 = 340mg  x 5 days  07/12/23 - Cycle 6 @200mg /m2 = 340mg  x 5 days   08/07/23  SURVEILLANCE                        NEURO-ONC RELATED ADVERSE EVENT HISTORY  SEIZURES - None  DVTs/PE - None  TREATMENT-RELATED COMPLICATIONS - None    INTERVAL HISTORY  Last seen: 06/08/23    This patient is accompanied by her daughter, [name] whom she prefers to assist with portuguese translation.    Since last visit, she completed adjuvant cycle 5 and 6 without any complications. She is doing well overall. She reports new onset left elbow to fingertips numbness and tingling x 2 months, mostly constant and affects worse on middle finger, and less to ring finger and pinky finger. States it does bother her at night time and interrupts sleep. She uses topical lidocaine which helps. And has tried a dose of her husband's gabapentin which also helps as well.  She reports left arm pain 2-3 years ago, more pain than numbness, and was given gabapentin for that pain in the past. She used to crochet, but due to numbness, is no longer to crochet.     She is independent at home otherwise. Ambulates independently at home, and uses a cane when going out. Denies falls. She is continuing to drive short distances. She takes care of her husband who had recent health issues but is improving.  Also, continues to cook and clean.      Stable wordfinding issues. Difficulty with reading and writing  Denies headaches. Denies seizures.  Since last visit, patient was taking Keppra 500mg  once a day, but then stopped taking it all together stating it makes her dizzy and fatigue. Denies any breakthrough seizures.     She also reports peripheral vision issues in the left eye only. It is not visible with right eye.  States her vision is blurry with her glasses. She has optometrist follow up in the next few weeks.     She denies back pain, urinary and bowel incontinence, numbness, or any other focal neurologic deficits.       Current Outpatient Medications   Medication Sig    amLODIPine 10 mg tablet Take 1 tablet (10 mg total) by mouth daily.    Ascorbic Acid (VITAMIN C) 1000 MG tablet Take 1 tablet (1,000 mg total) by mouth daily. (Patient not taking: Reported on 06/09/2023)    Cholecalciferol (VITAMIN D-3) 125 mcg (5000 units) TABS Take 1 tablet (125 mcg total) by mouth daily. (Patient not taking: Reported on 06/09/2023)    docusate 100 mg capsule Take 1 capsule (100 mg total) by mouth two (2) times daily.    FLUoxetine 20 mg capsule Take 2 capsules (40 mg total) by mouth daily.    Glucosamine HCl (GLUCOSAMINE PO) Take 1 capsule by mouth daily. levETIRAcetam 500 mg tablet Take 1 tablet (500 mg total) by mouth two (2) times daily.    ondansetron (ZOFRAN) 8 mg tablet Take 1 tablet (8 mg total) by mouth every eight (8) hours as needed for Nausea or Vomiting And Take 1 tab 1 hour before chemo.    pantoprazole 40 mg DR tablet Take 1 tablet (40 mg total) by mouth daily.    polyethylene glycol powder Take 17 g by mouth daily as needed (constipation)  Dissolve 17gm in [place] of water.     No current facility-administered medications for this visit.        Allergies:  No Known Allergies    Review of Systems:  A 14-system review of systems was performed and is negative except as stated in the history of present illness.        Exams:      Vitals:    There were no vitals taken for this visit. Exam performed via telemedicine, vital signs not obtained.     General: alert, appears stated age   Skin: No rash   HEENT: Craniotomy scar well healed. Eyes clear sclera/conjunctiva.  No Oropharyngeal lesions   Lungs:  breathing comfortably on RA   Abdomen:  soft   Extremities:  No clubbing cyanosis or edema     Neurologic exam:      Mental status:  Alert and oriented to person, place and time.  Attention is intact. Some difficulty with repetition (although limited by language barrier).  Recent memory intact, recalled 3/3 words immediately and 3/3 words at 5 minutes in [place].  Mild word finding difficulties. Naming intact to telephone, pen, paper and computer mouse. Able to follow 3 step commands that cross midline.    Cranial nerves: II: Visual field full   III, IV, VI: extraocular muscles motions intact  V: facial light touch sensation normal bilaterally  VII: facial muscle function normal bilaterally  VIII: hearing normal  IX: soft palate elevation  normal in midline  IX, X: gag reflex deferred  XI: trapezius sternocleidomastoid strength normal bilaterally  XII: tongue strength deferred   Sensory:  normal to light touch   Motor: Grossly non-focal and antigravity x4 Coordination: Intact FTN and FTF b/l   Reflexes: 2+   Gait:  Steady, narrow based gait with cane. Able to walk forwards and backwards.     KPS: 80    Imaging Studies:  I independently reviewed and interpreted the following imaging and with the patient:     MRI brain w/wo contrast (06/05/2023): I independently interpreted and reviewed the patient's imaging, compared to imaging from 04/12/2023, which demonstrates overall stable to mildly improved exam with possible decrease in enhancement along the resection cavity however can be due to difference in equipment (3T vs 1.5T). No evidence of tumor progression.      Pathology:            Recent Labs:  Labs completed @ [facility]  CBC and differential 06/07/2023  Specimen: Blood  Component  Ref Range & Units 06/07/2023   WBC  3.4 - 10.8 x10E3/uL 6.9   RBC  3.77 - 5.28 x10E6/uL 4.02   Hemoglobin  11.1 - 15.9 g/dL 45.4   Hematocrit  09.8 - 46.6 % 37.7   MCV  79 - 97 fL 94   MCH  26.6 - 33.0 pg 30.1   MCHC  31.5 - 35.7 g/dL 11.9   RDW  14.7 - 82.9 % 19.2 High    Platelets  150 - 450 x10E3/uL 226   NEUTROPHILS  Not Estab. % 49   Lymphs  Not Estab. % 42   Monocytes  Not Estab. % 8   Eos  Not Estab. % 1   Basos  Not Estab. % 0   Neutrophils (Absolute)  1.4 - 7.0 x10E3/uL 3.3   Lymphs (Absolute)  0.7 - 3.1 x10E3/uL 2.9   Monocytes (Absolute)  0.1 - 0.9 x10E3/uL 0.6   Eos (Absolute)  0.0 - 0.4  x10E3/uL 0.1   Baso (Absolute)  0.0 - 0.2 x10E3/uL 0   Immature Granulocytes  Not Estab. % 0   Immature Grans (Abs)  0.0 - 0.1 x10E3/uL 0     Comprehensive metabolic panel (06/07/2023)  Specimen: Blood  Component  Ref Range & Units 06/07/2023   Glucose  70 - 99 mg/dL 97   BUN  8 - 27 mg/dL 24   Creatinine, Ser  1.61 - 1.00 mg/dL 0.96   EGFR  >04 VW/UJW/1.19 64   BUN/Creatinine Ratio  12 - 28 26   Sodium  134 - 144 mmol/L 141   Potassium  3.5 - 5.2 mmol/L 4.3   Chloride  96 - 106 mmol/L 103   CO2  20 - 29 mmol/L 24   Calcium  8.7 - 10.3 mg/dL 14.7 High    Total Protein  6.0 - 8.5 g/dL 6.4   Albumin  3.8 - 4.8 g/dL 4.2   Globulin, Total  1.5 - 4.5 g/dL 2.2   Total Bilirubin  0.0 - 1.2 mg/dL 0.3   Alkaline Phosphatase  44 - 121 IU/L 80   AST  0 - 40 IU/L 18   ALT  0 - 32 IU/L 27       Assessment:   MABLENE LOTTMAN is a 78 y.o. female.  This patient has a newly diagnosed Glioblastoma, WHO Grade 4, unmethylated, EGFR amp, located in the left Temporal lobe.  In summary, the patient treatments include craniotomy with GTR. She completed 6 cycles of Adjuvant TMZ 02/16/23 - 07/16/23.     Since last visit, patient is stable clinically overall. She completed cycle 5 and 6 adjuvant TMZ without any complications or side effects. Denies headaches. Still very active at home.  Stable word-finding difficulty. Uses cane when going out, otherwise ambulates independently at home and fully active in ADLs and takes care of husband.     She reports new onset left elbow to fingers numbness and tingling x 2 months, worse in middle finger and ring finger which does interrupt sleep.  Given left sided (with hx right sided tumor), we do not believe this is tumor related and query ulner neuropathy. We discussed conservative treatment with continuing the topical lidocaine as well as rest/brace. Patient has a referral for general neurology and will follow up.     Denies headaches and seizures. She did stop her Keppra d/t dizziness. We discussed RBA of continuing keppra. Advised if she would like to continue driving as well as leading active lifestyle, we strongly recommend restarting AED.  Discussed option of Keppra ER for once daily dosing at nighttime, and she agreed.     Neuro exam today stable, mild word-finding but able to communicate.     Review of MRI brain w/wo contrast from 08/07/23 demonstrates overall stable to mildly improved exam with possible decrease in enhancement along the resection cavity. No evidence of tumor progression at this time.       Patient obtained referral for MRIs at [facility].  If she has difficulty making appointment, she will contact our team to schedule [place] MRI. If at [facility], prefers [employer] location at [facility].     We will continue MRI surveillance and labs every 8 weeks.     All patient questions answered today and they are in agreement with plan going forward.  The patient has our contact information and has been encouraged to call with any new symptom or problems, questions, concerns, or if there are any  changes in his neurological or functional status.      Plans/Recommendations:   #Glioblastoma  - MRI in 8 weeks with VV    - TBD at [facility] ~09/28/23 with VV on 10/03/23   - Daughter, [name], will email team for an upload link.   - Labs prior to each MRI  - Continue Prozac 20mg  every day  - Requested Cycle 6 D28 labs from [facility] for review     #Seizures  - Switch to Keppra ER 1000mg  daily at bedtime   -Advised to keep seizure diary   -Educated on seizures S/S   -Okay to drive if compliant with medications    #Left arm/hand neuropathy  - topical lidocaine PRN  - Follow up with general neurology    RTC (telemedicine) in 8 weeks with MRI brain and VV ( 10/02/23)    Future Appointments (if any - see below for last 10 appointments)  Future Appointments   Date Time Provider Department Center   10/13/2023 10:45 AM Ellyn Hack., MD [facility]     Attending Physician: Elana Alm. Micheal Likens, MD, PhD.  Author: Hulan Amato, NP      This patient was seen in collaboration with attending physician, Dr. Nichola Sizer.     30 minutes were spent personally by me, Meryl Dare, NP, today on this encounter which include today's pre-visit review of the chart, obtaining appropriate history, performing an evaluation, documentation and discussion of management with details supported within the note for today's visit. The time documented was exclusive of any time spent on the separately billed procedure.       ADDENDUM   10 minutes were spent personally by me today on this encounter which include today's pre-visit review of the chart, obtaining appropriate history, performing an evaluation, documentation and discussion of management with details supported within the note for today's visit. The time documented was exclusive of any time spent on the separately billed procedure.    I performed a substantive portion of an E/M visit face-to-face with patient CHANEY OLIGER on the same date of service with Meryl Dare, NP. I was personally involved in reviewing and conducting elements of the history, physical exam, adverse events, laboratory studies, and medical decision making, and agree with the above findings and plan of care documented in the NP's note along with my additions and/or corrections.      Shelby Anderle A. Micheal Likens, MD, PhD   [phone]

## 2023-10-01 ENCOUNTER — Other Ambulatory Visit: Payer: PRIVATE HEALTH INSURANCE

## 2023-10-02 NOTE — Telephone Encounter
 Appt confirmed

## 2023-10-06 ENCOUNTER — Inpatient Hospital Stay: Payer: PRIVATE HEALTH INSURANCE

## 2023-10-06 DIAGNOSIS — R6889 Other general symptoms and signs: Secondary | ICD-10-CM

## 2023-10-09 ENCOUNTER — Ambulatory Visit: Payer: PRIVATE HEALTH INSURANCE | Attending: Neurology

## 2023-10-09 DIAGNOSIS — C719 Malignant neoplasm of brain, unspecified: Secondary | ICD-10-CM

## 2023-10-09 DIAGNOSIS — G5692 Unspecified mononeuropathy of left upper limb: Secondary | ICD-10-CM

## 2023-10-09 DIAGNOSIS — Z2989 Seizure prophylaxis: Secondary | ICD-10-CM

## 2023-10-09 DIAGNOSIS — Z5111 Encounter for antineoplastic chemotherapy: Secondary | ICD-10-CM

## 2023-10-09 NOTE — Progress Notes
 Patient Consent to Telehealth Questionnaire   Patient agrees to the following.  - I agree  to be treated via a video visit and acknowledge that I may be liable for any relevant copays or coinsurance depending on my insurance plan.  - I understand that this video visit is offered for my convenience and I am able to cancel and reschedule for an in-person appointment if I desire.  - I also acknowledge that sensitive medical information may be discussed during this video visit appointment and that it is my responsibility to locate myself in a location that ensures privacy to my own level of comfort.  - I also acknowledge that I should not be participating in a video visit in a way that could cause danger to myself or to those around me (such as driving or walking).  If my provider is concerned about my safety, I understand that they have the right to terminate the visit.     [facility]  [place] NEURO-ONCOLOGY FOLLOW-UP NOTE    DATE OF SERVICE: 10/09/2023    REFERRING PROVIDER: Dr. Emeline Darling  PRIMARY CARE PROVIDER: Lawanna Kobus, MD  PROVIDER: Elana Alm. Micheal Likens, MD, PhD    REASON FOR FOLLOW-UP: Glioblastoma treatment, MRI review  Encounter Diagnoses   Name Primary?    Glioblastoma (HCC/RAF) Yes    Neuropathy, arm, left     Seizure prophylaxis     Chemotherapy management, encounter for     Encounter for chemotherapy management     GBM (glioblastoma multiforme) (HCC/RAF)      HISTORY OF PRESENT ILLNESS      [name] is a 78 y.o.  female, right-handed, who is seen for follow-up at the Neuro-Oncology Clinic at [facility] for glioblastoma treatment options and clinical review.      The patient presented to [facility] ER on 10/26/22 with word finding difficulties for 2 weeks when speaking, [place] (first language), [place], and [place]. CT head demonstrated a lesion in her left anterior temporal lobe and was transferred to [facility] for HLOC. She underwent left temporal craniotomy with GTR by Dr. Emeline Darling on 11/09/22 that was complicated by an epidural hematoma that was evacuated on 11/14/22 by Dr. Emeline Darling. Pathology demonstrated Glioblastoma, WHO Grade, 4, methylated, EGFR amp. 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     BRAIN TUMOR DIAGNOSIS    [x] GLIOMA [] MENINGIOMA [] METS[] OTHER[] unk    Neuropathology:  Glioblastoma WHO grade: IV, [id number]  Location: left  anterior temporal  Recurrence?:  [x] New  [] Recurrent Molecular studies:  MGMT Gene Methylation: methylated  1p/19q codeletion by FISH: not done  EGFR amplification by FISH: positive  NGS:          BRAIN/NERVOUS SYSTEM TUMOR TREATMENT HISTORY SUMMARY  NEW DX    11/09/22 Left temporal craniotomy with gross total resection of left temporal lesion by Dr. Emeline Darling at [facility]    Pathology at that time was consistent with Glioblastoma, WHO Grade 4   11/14/22 Left Craniotomy for epidural hematoma evacuation by Dr. Emeline Darling at [facility]   12/21/22 - 01/11/23 Radiation therapy, type unknown, to left temporal lesion,  for dosage of 30Gy, performed by Dr. Cecilie Lowers   12/21/22 - 01/11/23 started Chemotherapy TMZ, concurrent with radiation therapy, dose at 75mg /m2 125 mg   02/16/23 - 07/16/23 Adjuvant TMZ  02/16/23 - Cycle 1 @ 150mg /m2 = 255mg  x5 days  03/16/23 - Cyce 2 @ 200mg /m2 = 340mg  x 5 days  04/13/23 - Cycle 3 @200mg /m2 = 340mg  x 5 days  05/11/23 - Cycle 4 @ 200 mg/m2 = 340 mg x 5 days  06/08/23 - Cycle 5 @200mg /m2 = 340mg  x 5 days  07/12/23 - Cycle 6 @200mg /m2 = 340mg  x 5 days   08/07/23  SURVEILLANCE                        NEURO-ONC RELATED ADVERSE EVENT HISTORY  SEIZURES - None  DVTs/PE - None  TREATMENT-RELATED COMPLICATIONS - None    INTERVAL HISTORY  Last seen: 08/21/23    Following her last visit, patient has generally remained stable neurologically.  Continues to not like her Keppra; she was prescribed 1000mg  ER at bedtime but refused to take this.  She additionally saw an OSH neurologist who prescribed briviact, although she did not take this due to prohibitive cost.  She is only taking keppra 500mg  at bedtime currently.    She is also following with outside neurology for some numbness in digits 2-4 in her left hand.  No other signs of neuropathy or weakness.    Patient states that she is not eating much, although daughter says that she has adequate calorie intake.    Patient is most focused on her husband's health, who is suffering from worsening symptoms of Alzeimer's.    Denies any other new weakness, paresthesias, seizures, or other neurologic symtpoms.    Current Outpatient Medications   Medication Sig    amLODIPine 10 mg tablet Take 1 tablet (10 mg total) by mouth daily.    Ascorbic Acid (VITAMIN C) 1000 MG tablet Take 1 tablet (1,000 mg total) by mouth daily. (Patient not taking: Reported on 06/09/2023)    Cholecalciferol (VITAMIN D-3) 125 mcg (5000 units) TABS Take 1 tablet (125 mcg total) by mouth daily. (Patient not taking: Reported on 06/09/2023)    docusate 100 mg capsule Take 1 capsule (100 mg total) by mouth two (2) times daily.    FLUoxetine 20 mg capsule Take 2 capsules (40 mg total) by mouth daily.    Glucosamine HCl (GLUCOSAMINE PO) Take 1 capsule by mouth daily.    levETIRAcetam 500 mg 24 hr tablet Take 2 tablets (1,000 mg total) by mouth daily.    ondansetron (ZOFRAN) 8 mg tablet Take 1 tablet (8 mg total) by mouth every eight (8) hours as needed for Nausea or Vomiting And Take 1 tab 1 hour before chemo.    pantoprazole 40 mg DR tablet Take 1 tablet (40 mg total) by mouth daily.    polyethylene glycol powder Take 17 g by mouth daily as needed (constipation) Dissolve 17gm in 4-8oz of water.     No current facility-administered medications for this visit.     Allergies:  No Known Allergies    Review of Systems:  A 14-system review of systems was performed and is negative except as stated in the history of present illness.        Exams:      Vitals: There were no vitals taken for this visit. Exam performed via telemedicine, vital signs not obtained.     General: alert, appears stated age   Skin: No rash   HEENT: Craniotomy scar well healed. Eyes clear sclera/conjunctiva.  No Oropharyngeal lesions   Lungs:  breathing comfortably on RA   Abdomen:  soft   Extremities:  No clubbing cyanosis or edema  Neurologic exam:      Mental status:  Alert and oriented to person, place and time.  Attention is intact. Some difficulty with repetition (although limited by language barrier).  Recent memory intact, recalled 3/3 words immediately and 3/3 words at 5 minutes in [place].  Mild word finding difficulties. Naming intact to telephone, pen, paper and computer mouse. Able to follow 3 step commands that cross midline.    Cranial nerves: II: Visual field full   III, IV, VI: extraocular muscles motions intact  V: facial light touch sensation normal bilaterally  VII: facial muscle function normal bilaterally  VIII: hearing normal  IX: soft palate elevation  normal in midline  IX, X: gag reflex deferred  XI: trapezius sternocleidomastoid strength normal bilaterally  XII: tongue strength deferred   Sensory:  normal to light touch   Motor: Grossly non-focal and antigravity x4   Coordination: Intact FTN and FTF b/l   Reflexes: 2+   Gait:  Steady, narrow based gait with cane. Able to walk forwards and backwards.     KPS: 80    Imaging Studies:  I independently reviewed and interpreted the following imaging and with the patient:     MRI brain w/wo contrast (10/05/23, [facility] [place]): I independently interpreted and reviewed the patient's imaging, compared to imaging from prior, which demonstrates overall stable to mildly improved exam with possible decrease in enhancement along the resection cavity. No evidence of tumor progression.      Pathology:  No new pathology    Recent Labs:  Labs completed @ [facility]            Assessment:   [name] is a 78 y.o. female.  This patient has a newly diagnosed Glioblastoma, WHO Grade 4, unmethylated, EGFR amp, located in the left Temporal lobe.  In summary, the patient treatments include craniotomy with GTR. She completed 6 cycles of Adjuvant TMZ 02/16/23 - 07/16/23.     Since last visit, patient is stable clinically overall. She completed cycle 5 and 6 adjuvant TMZ without any complications or side effects. Denies headaches. Still very active at home.  Stable word-finding difficulty. Uses cane when going out, otherwise ambulates independently at home and fully active in ADLs and takes care of husband.     Neuro examination is stable, and patient with stable MRI Examination.  Discussed that we will continue with surveillance at this time, and with repeat lab work on next MRI date.    She will follow up with local neurologist regarding numbness in left hand (likely peripheral) as well as titration of AEDs.    All patient questions answered today and they are in agreement with plan going forward.  The patient has our contact information and has been encouraged to call with any new symptom or problems, questions, concerns, or if there are any changes in his neurological or functional status.    Plans/Recommendations:   #Glioblastoma  - MRI in 8 weeks with VV    - TBD at [facility]   - Daughter, [name], will email team for an upload link.   - Labs prior to each MRI  - Continue Prozac 20mg  every day     #Seizures  - Continue Keppra 500mg  BID   -Advised to keep seizure diary   -Educated on seizures S/S   -f/u local neurologist    #Left arm/hand neuropathy  - topical lidocaine PRN  - Follow up with general neurology    RTC (telemedicine)  in 8 weeks with MRI brain at OSH    Future Appointments (if any - see below for last 10 appointments)  Future Appointments   Date Time Provider Department Center   10/19/2023  8:30 AM Ellyn Hack., MD [facility]     Attending Physician: Elana Alm. Micheal Likens, MD, PhD.  Author: Elana Alm. Micheal Likens, MD, PhD    40 minutes were spent personally by me today on this encounter which include today's pre-visit review of the chart, obtaining appropriate history, performing an evaluation, documentation and discussion of management with details supported within the note for today's visit. The time documented was exclusive of any time spent on the separately billed procedure.

## 2023-10-13 ENCOUNTER — Ambulatory Visit: Payer: PRIVATE HEALTH INSURANCE

## 2023-10-19 ENCOUNTER — Ambulatory Visit: Payer: PRIVATE HEALTH INSURANCE

## 2023-10-19 ENCOUNTER — Other Ambulatory Visit: Payer: PRIVATE HEALTH INSURANCE

## 2023-10-19 DIAGNOSIS — C719 Malignant neoplasm of brain, unspecified: Secondary | ICD-10-CM

## 2023-10-19 NOTE — Progress Notes
 [place] [facility]  Phone: [phone]  Fax: [phone]  Department of Neurosurgery. [facility]  [address], [address]  [place], [place] [address]    PATIENT: [name]       MRN: [id number]       DOB: 1946/07/15  DATE OF SERVICE: 10/19/2023  TELEMEDICINE [x]      REFERRING PRACTITIONER: Ellyn Hack., MD  PRIMARY CARE PROVIDER: Lawanna Kobus, MD  VISIT DIAGNOSIS:   1. Glioblastoma (HCC/RAF)        Dear Dr. Allena Katz, thank you for referring Maylene Roes for neurosurgical consultation.    HISTORY AND PHYSICAL  [name] is a 78 y.o. female presenting for 1 year follow up after surgery for GBM. She has completed standard of care TMZ/radiotherapy. She has no recurrence. She is actually taking care of her husband who has worsening dementia. Able to carry out ADLs.    ASSESSMENT AND PLAN     I discussed  1) the diagnosis and differential diagnosis  2) potential causes / reasons for this diagnosis  3) short and long term consequences including the untreated natural history  4) treatment options which may include: further diagnostic workup, observation, medical therapy, radiation therapy, and surgery    I answered all questions the patient had regarding this information. A consensus decision was to pursue to below treatment plan.    Continue follow up with Dr. Micheal Likens for surveillance off treatment  Referral for physical therapy for her neck and back radiculopathy  Referral to palliative for cancer diagnosis    Dr. Allena Katz and Lawanna Kobus, MD, thank you for your confidence in allowing me to share in the care of your patient.    This patient presented with:  A chronic problem causing threat to life/bodily function    I provided care including:  Independent historian participation  Review of previous EMR documentation including primary care and specialist workup  Independent interpretation of neuro-imaging    This patient's care needed  Elective major surgery w/ risk factors     65 minutes were spent personally by me today on this encounter which include today's pre-visit review of the chart, obtaining appropriate history, performing an evaluation, documentation and discussion of management with details supported within the note for today's visit. The time documented was exclusive of any time spent on the separately billed procedure.      Sausha Raymond S. Allena Katz  Assistant Professor  Department of Neurosurgery  [employer]  [place] [employer] [place]  [phone]    [facility]  [address] 4th floor  [place] [address]   [phone] (Appointments)  [phone] (Fax)

## 2023-11-02 ENCOUNTER — Telehealth: Payer: Commercial Managed Care - Pharmacy Benefit Manager

## 2023-11-02 NOTE — Telephone Encounter
 [name], [name] MRN [id number]   Patient's daughter called to follow  up on the Palliative Care referral. I spoke with patient's daughter and discussed the Palliative Care referral. I answered any questions or concerns regarding palliative care and offered assistance scheduling a palliative care consultation. Patient's daughter preferred to hold off for now and call back at a later time.                                       Buck Mam  [employer]    d: [phone]  f: [phone]  e: [email or web address]

## 2023-12-01 ENCOUNTER — Inpatient Hospital Stay: Payer: Commercial Managed Care - Pharmacy Benefit Manager

## 2023-12-01 DIAGNOSIS — Z719 Counseling, unspecified: Secondary | ICD-10-CM

## 2023-12-04 ENCOUNTER — Ambulatory Visit: Payer: Commercial Managed Care - Pharmacy Benefit Manager | Attending: Neurology

## 2023-12-04 DIAGNOSIS — Z5111 Encounter for antineoplastic chemotherapy: Secondary | ICD-10-CM

## 2023-12-04 DIAGNOSIS — Z2989 Seizure prophylaxis: Secondary | ICD-10-CM

## 2023-12-04 DIAGNOSIS — G5692 Unspecified mononeuropathy of left upper limb: Secondary | ICD-10-CM

## 2023-12-04 DIAGNOSIS — C719 Malignant neoplasm of brain, unspecified: Secondary | ICD-10-CM

## 2023-12-04 NOTE — Progress Notes
 Patient Consent to Telehealth Questionnaire   Patient agrees to the following.  - I agree  to be treated via a video visit and acknowledge that I may be liable for any relevant copays or coinsurance depending on my insurance plan.  - I understand that this video visit is offered for my convenience and I am able to cancel and reschedule for an in-person appointment if I desire.  - I also acknowledge that sensitive medical information may be discussed during this video visit appointment and that it is my responsibility to locate myself in a location that ensures privacy to my own level of comfort.  - I also acknowledge that I should not be participating in a video visit in a way that could cause danger to myself or to those around me (such as driving or walking).  If my provider is concerned about my safety, I understand that they have the right to terminate the visit.     [facility] BRAIN TUMOR CENTER  [place] NEURO-ONCOLOGY FOLLOW-UP NOTE    DATE OF SERVICE: 12/04/2023    REFERRING PROVIDER: Dr. Joelene Murrain  PRIMARY CARE PROVIDER: Cannon Champion, MD  PROVIDER: Rubbie Cornwall. Renaye Carp, MD, PhD    REASON FOR FOLLOW-UP: Glioblastoma treatment, MRI review  No diagnosis found.    HISTORY OF PRESENT ILLNESS      [name] is a 78 y.o.  female, right-handed, who is seen for follow-up at the Neuro-Oncology Clinic at [facility] for glioblastoma treatment options and clinical review.      The patient presented to [facility] ER on 10/26/22 with word finding difficulties for 2 weeks when speaking, [place] (first language), [place], and [place]. CT head demonstrated a lesion in her left anterior temporal lobe and was transferred to [facility] RR for HLOC. She underwent left temporal craniotomy with GTR by Dr. Joelene Murrain on 11/09/22 that was complicated by an epidural hematoma that was evacuated on 11/14/22 by Dr. Joelene Murrain. Pathology demonstrated Glioblastoma, WHO Grade, 4, methylated, EGFR amp. 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     BRAIN TUMOR DIAGNOSIS    [x] GLIOMA [] MENINGIOMA [] METS[] OTHER[] unk    Neuropathology:  Glioblastoma WHO grade: IV, [id number]  Location: left  anterior temporal  Recurrence?:  [x] New  [] Recurrent Molecular studies:  MGMT Gene Methylation: methylated  1p/19q codeletion by FISH: not done  EGFR amplification by FISH: positive  NGS:          BRAIN/NERVOUS SYSTEM TUMOR TREATMENT HISTORY SUMMARY  NEW DX    11/09/22 Left temporal craniotomy with gross total resection of left temporal lesion by Dr. Joelene Murrain at [facility]    Pathology at that time was consistent with Glioblastoma, WHO Grade 4   11/14/22 Left Craniotomy for epidural hematoma evacuation by Dr. Joelene Murrain at [facility]   12/21/22 - 01/11/23 Radiation therapy, type unknown, to left temporal lesion,  for dosage of 30Gy, performed by Dr. Elane Grays   12/21/22 - 01/11/23 started Chemotherapy TMZ, concurrent with radiation therapy, dose at 75mg /m2 125 mg   02/16/23 - 07/16/23 Adjuvant TMZ  02/16/23 - Cycle 1 @ 150mg /m2 = 255mg  x5 days  03/16/23 - Cyce 2 @ 200mg /m2 = 340mg  x 5 days  04/13/23 - Cycle 3 @200mg /m2 = 340mg  x 5 days  05/11/23 - Cycle 4 @ 200 mg/m2 = 340 mg x 5 days  06/08/23 - Cycle 5 @200mg /m2 = 340mg  x 5  days  07/12/23 - Cycle 6 @200mg /m2 = 340mg  x 5 days   08/07/23  SURVEILLANCE                        NEURO-ONC RELATED ADVERSE EVENT HISTORY  SEIZURES - None  DVTs/PE - None  TREATMENT-RELATED COMPLICATIONS - None    INTERVAL HISTORY  Last seen: 10/09/23    Following her last visit, patient has generally remained stable neurologically.  Continues to not like her Keppra ; she was prescribed 1000mg  ER at bedtime but refused to take this. She is followed by outside neurologist who suggested Briviact, but does not wish to start d/t cost.    She is also following with outside neurology for some numbness in digits 2-4 in her left hand who recommended EMG, but she declined test as she does not wish to pursue surgical intervention.  No other signs of neuropathy or weakness.     Reports her appetite and have taste have improved and is eating more. Reports 25lb weight loss over the past year.    Patient is most focused on her husband's health, who is suffering from worsening symptoms of Alzeimer's.    Denies any other new weakness, paresthesias, seizures, or other neurologic symtpoms.    Current Outpatient Medications   Medication Sig    amLODIPine 10 mg tablet Take 1 tablet (10 mg total) by mouth daily.    Ascorbic Acid (VITAMIN C) 1000 MG tablet Take 1 tablet (1,000 mg total) by mouth daily. (Patient not taking: Reported on 06/09/2023)    Cholecalciferol (VITAMIN D-3) 125 mcg (5000 units) TABS Take 1 tablet (125 mcg total) by mouth daily. (Patient not taking: Reported on 06/09/2023)    docusate 100 mg capsule Take 1 capsule (100 mg total) by mouth two (2) times daily.    FLUoxetine  20 mg capsule Take 2 capsules (40 mg total) by mouth daily.    Glucosamine HCl (GLUCOSAMINE PO) Take 1 capsule by mouth daily.    levETIRAcetam  500 mg 24 hr tablet Take 2 tablets (1,000 mg total) by mouth daily.    ondansetron  (ZOFRAN ) 8 mg tablet Take 1 tablet (8 mg total) by mouth every eight (8) hours as needed for Nausea or Vomiting And Take 1 tab 1 hour before chemo.    pantoprazole  40 mg DR tablet Take 1 tablet (40 mg total) by mouth daily.    polyethylene glycol powder Take 17 g by mouth daily as needed (constipation) Dissolve 17gm in 4-8oz of water.     No current facility-administered medications for this visit.     Allergies:  No Known Allergies    Review of Systems:  A 14-system review of systems was performed and is negative except as stated in the history of present illness.        Exams:      Vitals:    There were no vitals taken for this visit. Exam performed via telemedicine, vital signs not obtained.     General: alert, appears stated age   Skin: No rash   HEENT: Craniotomy scar well healed. Eyes clear sclera/conjunctiva.  No Oropharyngeal lesions   Lungs:  breathing comfortably on RA   Abdomen:  soft   Extremities:  No clubbing cyanosis or edema     Neurologic exam:      Mental status:  Alert and oriented to person, place and time.  Attention is intact. Some difficulty with repetition (although limited by language barrier).  Recent memory intact,  recalled 3/3 words immediately and 3/3 words at 5 minutes in [place].  Mild word finding difficulties. Naming intact to telephone, pen, paper and computer mouse. Able to follow 3 step commands that cross midline.    Cranial nerves: II: Visual field full   III, IV, VI: extraocular muscles motions intact  V: facial light touch sensation normal bilaterally  VII: facial muscle function normal bilaterally  VIII: hearing normal  IX: soft palate elevation  normal in midline  IX, X: gag reflex deferred  XI: trapezius sternocleidomastoid strength normal bilaterally  XII: tongue strength deferred   Sensory:  normal to light touch   Motor: Grossly non-focal and antigravity x4   Coordination: Intact FTN and FTF b/l   Reflexes: 2+   Gait:  Steady, narrow based gait with cane. Able to walk forwards and backwards.     KPS: 80    Imaging Studies:  I independently reviewed and interpreted the following imaging and with the patient:     MRI brain w/wo contrast (11/30/23, OSH [facility]): I independently interpreted and reviewed the patient's imaging, compared to imaging from prior, which demonstrates stable disease with no evidence of tumor progression.      Pathology:  No new pathology    Recent Labs:  Labs completed @ [facility]            Assessment:   [name] is a 78 y.o. female.  This patient has a newly diagnosed Glioblastoma, WHO Grade 4, unmethylated, EGFR amp, located in the left Temporal lobe.  In summary, the patient treatments include craniotomy with GTR. She completed 6 cycles of Adjuvant TMZ 02/16/23 - 07/16/23 and is now on MRI surveillance    Since last visit, patient is stable clinically overall with no new complaints. Reports left finger numbness is stable to improved, denies muscle weakness. She is followed by outside neurology and declined EMG testing. Denies headaches. Stable word-finding difficulty. Uses cane when going out, otherwise ambulates independently at home and fully active in ADLs and takes care of husband. Denies seizures, currently not on AEDs and managed by local neurologist.    Neuro examination is stable, and patient with stable MRI Examination.  Discussed that we will continue with surveillance at this time with MRI q8weeks until 1/5-2 years after diagnosis and then can consider increasing interval between scans. Plan for repeat labs prior to MRI.    She will follow up with local neurologist regarding numbness in left hand (likely peripheral) as well as titration of AEDs.    All patient questions answered today and they are in agreement with plan going forward.  The patient has our contact information and has been encouraged to call with any new symptom or problems, questions, concerns, or if there are any changes in his neurological or functional status.    Plans/Recommendations:   #Glioblastoma  - MRI in 8 weeks with VV    - TBD at [facility]   - Daughter, Carlon Chester, will email team for an upload link.   - Labs prior to each MRI  - Continue Prozac  20mg  every day     #Seizures  - F/u with outside neurologist for management   -Advised to keep seizure diary   -Educated on seizures S/S    #Left arm/hand neuropathy  - topical lidocaine  PRN  - Follow up with general neurology    RTC (telemedicine) in 8 weeks with MRI brain at OSH    Future Appointments (if any -  see below for last 10 appointments)  No future appointments.    Attending Physician: Rubbie Cornwall. Renaye Carp, MD, PhD.  Author: Diana Forster, NP    20 minutes were spent personally by me today on this encounter which include today's pre-visit review of the chart, obtaining appropriate history, performing an evaluation, documentation and discussion of management with details supported within the note for today's visit. The time documented was exclusive of any time spent on the separately billed procedure.    ADDENDUM   20 minutes were spent personally by me today on this encounter which include today's pre-visit review of the chart, obtaining appropriate history, performing an evaluation, documentation and discussion of management with details supported within the note for today's visit. The time documented was exclusive of any time spent on the separately billed procedure.    I performed a substantive portion of an E/M visit face-to-face with patient [name] on the same date of service with Alyn Judge, NP.  I was personally involved in reviewing and conducting elements of the history, physical exam, adverse events, laboratory studies, and medical decision making, and agree with the above findings and plan of care documented in the NP's note along with my additions and/or corrections.      Reshma Hoey A. Renaye Carp, MD, PhD   [phone]

## 2023-12-22 ENCOUNTER — Ambulatory Visit: Payer: Commercial Managed Care - Pharmacy Benefit Manager

## 2024-01-10 ENCOUNTER — Ambulatory Visit: Payer: PRIVATE HEALTH INSURANCE

## 2024-01-10 DIAGNOSIS — C719 Malignant neoplasm of brain, unspecified: Secondary | ICD-10-CM

## 2024-01-28 ENCOUNTER — Inpatient Hospital Stay: Payer: PRIVATE HEALTH INSURANCE

## 2024-01-28 DIAGNOSIS — C719 Malignant neoplasm of brain, unspecified: Secondary | ICD-10-CM

## 2024-01-28 MED ADMIN — GADOBUTROL 1 MMOL/ML IV SOLN: 7.5 mL | INTRAVENOUS | Stop: 2024-01-28 | NDC 65219028107

## 2024-01-29 ENCOUNTER — Inpatient Hospital Stay: Payer: PRIVATE HEALTH INSURANCE

## 2024-01-29 ENCOUNTER — Ambulatory Visit: Payer: Commercial Managed Care - Pharmacy Benefit Manager | Attending: Neurology

## 2024-01-29 DIAGNOSIS — C719 Malignant neoplasm of brain, unspecified: Secondary | ICD-10-CM

## 2024-01-29 NOTE — Progress Notes
 Patient Consent to Telehealth Questionnaire   Patient agrees to the following.  - I agree  to be treated via a video visit and acknowledge that I may be liable for any relevant copays or coinsurance depending on my insurance plan.  - I understand that this video visit is offered for my convenience and I am able to cancel and reschedule for an in-person appointment if I desire.  - I also acknowledge that sensitive medical information may be discussed during this video visit appointment and that it is my responsibility to locate myself in a location that ensures privacy to my own level of comfort.  - I also acknowledge that I should not be participating in a video visit in a way that could cause danger to myself or to those around me (such as driving or walking).  If my provider is concerned about my safety, I understand that they have the right to terminate the visit.     [facility] BRAIN TUMOR CENTER  [place] NEURO-ONCOLOGY FOLLOW-UP NOTE    DATE OF SERVICE: 01/29/2024    REFERRING PROVIDER: Dr. Pauline Blanch  PRIMARY CARE PROVIDER: Laurita Pointer, MD  PROVIDER: Lamar LABOR. Windsor, MD, PhD    REASON FOR FOLLOW-UP: Glioblastoma treatment, MRI review  Encounter Diagnoses   Name Primary?    Glioblastoma (HCC/RAF) Yes    Neuropathy, arm, left        HISTORY OF PRESENT ILLNESS      [name] is a 78 y.o.  female, right-handed, who is seen for follow-up at the Neuro-Oncology Clinic at [facility] for glioblastoma treatment options and clinical review.      The patient presented to [facility] ER on 10/26/22 with word finding difficulties for 2 weeks when speaking, [place] (first language), [place], and [place]. CT head demonstrated a lesion in her left anterior temporal lobe and was transferred to [facility] for HLOC. She underwent left temporal craniotomy with GTR by Dr. Pauline Blanch on 11/09/22 that was complicated by an epidural hematoma that was evacuated on 11/14/22 by Dr. Pauline Blanch. Pathology demonstrated Glioblastoma, WHO Grade, 4, methylated, EGFR amp. 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     BRAIN TUMOR DIAGNOSIS    [x] GLIOMA [] MENINGIOMA [] METS[] OTHER[] unk    Neuropathology:  Glioblastoma WHO grade: IV, [id number]  Location: left  anterior temporal  Recurrence?:  [x] New  [] Recurrent Molecular studies:  MGMT Gene Methylation: methylated  1p/19q codeletion by FISH: not done  EGFR amplification by FISH: positive  NGS:          BRAIN/NERVOUS SYSTEM TUMOR TREATMENT HISTORY SUMMARY  NEW DX    11/09/22 Left temporal craniotomy with gross total resection of left temporal lesion by Dr. Pauline Blanch at [facility]    Pathology at that time was consistent with Glioblastoma, WHO Grade 4   11/14/22 Left Craniotomy for epidural hematoma evacuation by Dr. Pauline Blanch at [facility]   12/21/22 - 01/11/23 Radiation therapy, type unknown, to left temporal lesion,  for dosage of 30Gy, performed by Dr. Rosalba Rundle   12/21/22 - 01/11/23 started Chemotherapy TMZ, concurrent with radiation therapy, dose at 75mg /m2 125 mg   02/16/23 - 07/16/23 Adjuvant TMZ  02/16/23 - Cycle 1 @ 150mg /m2 = 255mg  x5 days  03/16/23 - Cyce 2 @ 200mg /m2 = 340mg  x 5 days  04/13/23 - Cycle 3 @200mg /m2 = 340mg  x 5 days  05/11/23 - Cycle 4 @  200 mg/m2 = 340 mg x 5 days  06/08/23 - Cycle 5 @200mg /m2 = 340mg  x 5 days  07/12/23 - Cycle 6 @200mg /m2 = 340mg  x 5 days   08/07/23  SURVEILLANCE                        NEURO-ONC RELATED ADVERSE EVENT HISTORY  SEIZURES - None  DVTs/PE - None  TREATMENT-RELATED COMPLICATIONS - None    INTERVAL HISTORY  Last seen: 12/04/23    Following her last visit, patient has generally remained stable neurologically. She endorses short term memory difficulty.  She reports improved appetite and energy after starting supplemental daily protein drink. Has overall regained back 10 lbs of her previous 20 lb weight loss post-surgery.      She is followed by outside neurologist for her AED management. - did not like keppra  and does not want to try briviact due to cost. Is not currently taking AEDs.     She is also following with outside neurology for some numbness in digits 2-4 in her left hand who recommended EMG, but she declined test as she does not wish to pursue surgical intervention.  No other signs of neuropathy or weakness.     She will be having cataract surgery in August 2025.     Patient is most focused on her husband's health, who is suffering from worsening symptoms of Alzeimer's.    Denies any other new weakness, paresthesias, seizures, or other neurologic symtpoms.    Current Outpatient Medications   Medication Sig    amLODIPine 10 mg tablet Take 1 tablet (10 mg total) by mouth daily.    Ascorbic Acid (VITAMIN C) 1000 MG tablet Take 1 tablet (1,000 mg total) by mouth daily. (Patient not taking: Reported on 06/09/2023)    Cholecalciferol (VITAMIN D-3) 125 mcg (5000 units) TABS Take 1 tablet (125 mcg total) by mouth daily. (Patient not taking: Reported on 06/09/2023)    docusate 100 mg capsule Take 1 capsule (100 mg total) by mouth two (2) times daily.    FLUoxetine  20 mg capsule Take 2 capsules (40 mg total) by mouth daily.    Glucosamine HCl (GLUCOSAMINE PO) Take 1 capsule by mouth daily.    levETIRAcetam  500 mg 24 hr tablet Take 2 tablets (1,000 mg total) by mouth daily.    ondansetron  (ZOFRAN ) 8 mg tablet Take 1 tablet (8 mg total) by mouth every eight (8) hours as needed for Nausea or Vomiting And Take 1 tab 1 hour before chemo.     No current facility-administered medications for this visit.     Facility-Administered Medications Ordered in Other Visits   Medication Dose Route Frequency    [COMPLETED] gadobutrol  (Gadavist ) 1 mmol/mL inj 7.5 mL  7.5 mL Intravenous Once     Allergies:  No Known Allergies    Review of Systems:  A 14-system review of systems was performed and is negative except as stated in the history of present illness.        Exams:      Vitals:    There were no vitals taken for this visit. Exam performed via telemedicine, vital signs not obtained.     General: alert, appears stated age   Skin: No rash   HEENT: Craniotomy scar well healed. Eyes clear sclera/conjunctiva.  No Oropharyngeal lesions   Lungs:  breathing comfortably on RA   Abdomen:  soft   Extremities:  No clubbing cyanosis or edema  Neurologic exam:      Mental status:  Alert and oriented to person, place and time.  Attention is intact. Some difficulty with repetition (although limited by language barrier).  Recent memory intact, recalled 3/3 words immediately and 1/3 words at 5 minutes and 3/3 at 5 minutes with cueing.   Mild word finding difficulties. Naming intact to telephone, pen, paper and computer mouse. Able to follow 3 step commands that cross midline.    Cranial nerves: II: Visual field full   III, IV, VI: extraocular muscles motions intact  V: facial light touch sensation normal bilaterally  VII: facial muscle function normal bilaterally  VIII: hearing normal  IX: soft palate elevation  normal in midline  IX, X: gag reflex deferred  XI: trapezius sternocleidomastoid strength normal bilaterally  XII: tongue strength deferred   Sensory:  normal to light touch (decreased sensation to left middle finger)   Motor: Grossly non-focal and antigravity x4   Coordination: Intact FTN and FTF b/l   Reflexes: deferred   Gait:  Steady, narrow based gait with cane. Able to walk forwards and backwards.     KPS: 80 (nonfluent aphasia and short term memory difficulty)    Imaging Studies:    MRI brain w/wo contrast from 01/28/24 was independently reviewed by attending physician, Dr. Windsor which demonstrates no evidence of tumor progression.        Pathology:  No new pathology    Recent Labs:  Labs completed @ [facility]                    Assessment:   OMOLARA CAROL is a 78 y.o. female.  This patient has a newly diagnosed Glioblastoma, WHO Grade 4, unmethylated, EGFR amp, located in the left Temporal lobe.  In summary, the patient treatments include craniotomy with GTR. She completed 6 cycles of Adjuvant TMZ 02/16/23 - 07/16/23 and is now on MRI surveillance    Since last visit, patient is stable clinically overall with no new complaints. Reports left finger numbness is stable. Denies any pain or weakness. She is followed by outside neurology and declined EMG testing. Denies headaches. Stable word-finding difficulty. Uses cane when going out, otherwise ambulates independently at home and fully active in ADLs and takes care of husband. Denies seizures, currently not on AEDs which is managed by local neurologist.    Neuro examination is stable with left hand finger numbness. MRI brain w/wo contrast from 01/28/24 was independently reviewed by attending physician, Dr. Windsor which demonstrates no evidence of tumor progression.      Discussed that we will continue with surveillance at this time with MRI q8weeks until 1/5-2 years after diagnosis and then can consider increasing interval between scans. Plan for repeat labs prior to MRI.    She will follow up with local neurologist regarding numbness in left hand (likely peripheral) as well as titration of AEDs.    All patient questions answered today and they are in agreement with plan going forward.  The patient has our contact information and has been encouraged to call with any new symptom or problems, questions, concerns, or if there are any changes in his neurological or functional status.    Plans/Recommendations:   #Glioblastoma  - MRI in 8 weeks with VV    - TBD at [facility]   - Daughter, Ronnald, will email team for an upload link.   - Labs prior to each MRI  - Continue Prozac  20mg  every day     #  Seizures  - F/u with outside neurologist for management   -Advised to keep seizure diary   -Educated on seizures S/S    #Left arm/hand neuropathy  - topical lidocaine  PRN  - Follow up with general neurology    RTC (telemedicine) in 8 weeks with MRI brain at OSH    Future Appointments (if any - see below for last 10 appointments)  Future Appointments   Date Time Provider Department Center   01/29/2024 11:00 AM Windsor Lamar LABOR., MD, PhD [facility] [place]       Attending Physician: Lamar LABOR. Windsor, MD, PhD.  Author: Steva MICAEL Reaper, NP    30 minutes were spent personally by me today on this encounter which include today's pre-visit review of the chart, obtaining appropriate history, performing an evaluation, documentation and discussion of management with details supported within the note for today's visit. The time documented was exclusive of any time spent on the separately billed procedure.    ADDENDUM   10 minutes were spent personally by me today on this encounter which include today's pre-visit review of the chart, obtaining appropriate history, performing an evaluation, documentation and discussion of management with details supported within the note for today's visit. The time documented was exclusive of any time spent on the separately billed procedure.      I performed a substantive portion of an E/M visit face-to-face with patient ELNA RADOVICH on the same date of service with Steva Reaper, NP. I was personally involved in reviewing and conducting elements of the history, physical exam, adverse events, laboratory studies, and medical decision making, and agree with the above findings and plan of care documented in the NP's note along with my additions and/or corrections.      Kaison Mcparland A. Windsor, MD, PhD   [phone]

## 2024-01-31 DIAGNOSIS — G5692 Unspecified mononeuropathy of left upper limb: Secondary | ICD-10-CM

## 2024-01-31 DIAGNOSIS — C719 Malignant neoplasm of brain, unspecified: Secondary | ICD-10-CM

## 2024-02-08 ENCOUNTER — Ambulatory Visit: Payer: PRIVATE HEALTH INSURANCE

## 2024-02-08 MED ORDER — FLUOXETINE HCL 20 MG PO CAPS
40 mg | ORAL_CAPSULE | Freq: Every day | ORAL | 3 refills | 90.00000 days | Status: AC
Start: 2024-02-08 — End: ?

## 2024-03-21 ENCOUNTER — Ambulatory Visit: Payer: Commercial Managed Care - Pharmacy Benefit Manager | Attending: Neurology

## 2024-03-25 ENCOUNTER — Ambulatory Visit: Payer: Commercial Managed Care - Pharmacy Benefit Manager | Attending: Neurology

## 2024-04-15 ENCOUNTER — Telehealth: Payer: PRIVATE HEALTH INSURANCE

## 2024-04-15 NOTE — Telephone Encounter
 Hello,    Kindly assist w/obtaining Ref/Auth from [facility] for the scheduled appt this Thursday 04/18/2024.    TY

## 2024-04-17 ENCOUNTER — Inpatient Hospital Stay: Payer: Commercial Managed Care - Pharmacy Benefit Manager

## 2024-04-17 DIAGNOSIS — Z719 Counseling, unspecified: Secondary | ICD-10-CM

## 2024-04-17 NOTE — Telephone Encounter
Authorization submitted.

## 2024-04-18 ENCOUNTER — Ambulatory Visit: Payer: Commercial Managed Care - Pharmacy Benefit Manager | Attending: Neurology

## 2024-04-18 DIAGNOSIS — C719 Malignant neoplasm of brain, unspecified: Principal | ICD-10-CM

## 2024-04-18 NOTE — Progress Notes
 Patient Consent to Telehealth Questionnaire   Patient agrees to the following.  - I agree  to be treated via a video visit and acknowledge that I may be liable for any relevant copays or coinsurance depending on my insurance plan.  - I understand that this video visit is offered for my convenience and I am able to cancel and reschedule for an in-person appointment if I desire.  - I also acknowledge that sensitive medical information may be discussed during this video visit appointment and that it is my responsibility to locate myself in a location that ensures privacy to my own level of comfort.  - I also acknowledge that I should not be participating in a video visit in a way that could cause danger to myself or to those around me (such as driving or walking).  If my provider is concerned about my safety, I understand that they have the right to terminate the visit.     [facility] BRAIN TUMOR CENTER  [place] NEURO-ONCOLOGY FOLLOW-UP NOTE    DATE OF SERVICE: 04/18/2024    REFERRING PROVIDER: Dr. Pauline Blanch  PRIMARY CARE PROVIDER: Laurita Pointer, MD  PROVIDER: Lamar LABOR. Windsor, MD, PhD    REASON FOR FOLLOW-UP: Glioblastoma treatment, MRI review  Encounter Diagnosis   Name Primary?    Glioblastoma (HCC/RAF) Yes         HISTORY OF PRESENT ILLNESS      [name] is a 78 y.o.  female, right-handed, who is seen for follow-up at the Neuro-Oncology Clinic at [facility] for glioblastoma treatment options and clinical review.      The patient presented to [facility] ER on 10/26/22 with word finding difficulties for 2 weeks when speaking, [place] (first language), [place], and [place]. CT head demonstrated a lesion in her left anterior temporal lobe and was transferred to [facility] for HLOC. She underwent left temporal craniotomy with GTR by Dr. Pauline Blanch on 11/09/22 that was complicated by an epidural hematoma that was evacuated on 11/14/22 by Dr. Pauline Blanch. Pathology demonstrated Glioblastoma, WHO Grade, 4, methylated, EGFR amp. 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     BRAIN TUMOR DIAGNOSIS    [x] GLIOMA [] MENINGIOMA [] METS[] OTHER[] unk    Neuropathology:  Glioblastoma WHO grade: IV, PIY:tpoiubez  Location: left anterior temporal  Recurrence?:  [x] New  [] Recurrent Molecular studies:  MGMT Gene Methylation: methylated  1p/19q codeletion by FISH: not done  EGFR amplification by FISH: positive  NGS:         BRAIN/NERVOUS SYSTEM TUMOR TREATMENT HISTORY SUMMARY  NEW DX    11/09/22 Left temporal craniotomy with gross total resection of left temporal lesion by Dr. Pauline Blanch at [facility]    Pathology at that time was consistent with Glioblastoma, WHO Grade 4   11/14/22 Left Craniotomy for epidural hematoma evacuation by Dr. Pauline Blanch at [facility]   12/21/22 - 01/11/23 Radiation therapy, type unknown, to left temporal lesion,  for dosage of 30Gy, performed by Dr. Rosalba Rundle   12/21/22 - 01/11/23 started Chemotherapy TMZ, concurrent with radiation therapy, dose at 75mg /m2 125 mg   02/16/23 - 07/16/23 Adjuvant TMZ  02/16/23 - Cycle 1 @ 150mg /m2 = 255mg  x5 days  03/16/23 - Cyce 2 @ 200mg /m2 = 340mg  x 5 days  04/13/23 - Cycle 3 @200mg /m2 = 340mg  x 5 days  05/11/23 - Cycle 4 @ 200 mg/m2 = 340 mg x  5 days  06/08/23 - Cycle 5 @200mg /m2 = 340mg  x 5 days  07/12/23 - Cycle 6 @200mg /m2 = 340mg  x 5 days   08/07/23  SURVEILLANCE                        NEURO-ONC RELATED ADVERSE EVENT HISTORY  SEIZURES - None  DVTs/PE - None  TREATMENT-RELATED COMPLICATIONS - None    INTERVAL HISTORY  Last seen: 01/29/24    Following her last visit, patient has generally remained stable neurologically with stable short term memory difficulty and word finding difficulty.  She reports episodes of increased speech difficulty, no LOC, that last about 1 hour on 04/05/24, 02/18/24, and 10/18/23.  Denies seizures. Remains off AEDs and is followed by local neurologist for AED management.     She is very independent, cooks, takes care of her husband.      She had cataract surgery in August and has recovered without any complications.    She is followed by outside neurologist for her AED management. - did not like keppra  and does not want to try briviact due to cost. Is not currently taking AEDs.     She is also following with outside neurology for some numbness in digits 2-4 in her left hand who recommended EMG, but she declined test as she does not wish to pursue surgical intervention.  No other signs of neuropathy or weakness.     Patient is most focused on her husband's health, who is suffering from worsening symptoms of Alzeimer's.    Denies any other new weakness, paresthesias, seizures, or other neurologic symtpoms.    Current Outpatient Medications   Medication Sig    amLODIPine 10 mg tablet Take 1 tablet (10 mg total) by mouth daily.    Ascorbic Acid (VITAMIN C) 1000 MG tablet Take 1 tablet (1,000 mg total) by mouth daily. (Patient not taking: Reported on 06/09/2023)    Cholecalciferol (VITAMIN D-3) 125 mcg (5000 units) TABS Take 1 tablet (125 mcg total) by mouth daily. (Patient not taking: Reported on 06/09/2023)    docusate 100 mg capsule Take 1 capsule (100 mg total) by mouth two (2) times daily.    FLUoxetine  20 mg capsule Take 2 capsules (40 mg total) by mouth daily.    Glucosamine HCl (GLUCOSAMINE PO) Take 1 capsule by mouth daily.    levETIRAcetam  500 mg 24 hr tablet Take 2 tablets (1,000 mg total) by mouth daily.    ondansetron  (ZOFRAN ) 8 mg tablet Take 1 tablet (8 mg total) by mouth every eight (8) hours as needed for Nausea or Vomiting And Take 1 tab 1 hour before chemo.     No current facility-administered medications for this visit.     Allergies:  No Known Allergies    Review of Systems:  A 14-system review of systems was performed and is negative except as stated in the history of present illness.        Exams:      Vitals:    There were no vitals taken for this visit. Exam performed via telemedicine, vital signs not obtained.     General: alert, appears stated age   Skin: No rash   HEENT: Craniotomy scar well healed. Eyes clear sclera/conjunctiva.  No Oropharyngeal lesions   Lungs:  breathing comfortably on RA   Abdomen:  soft   Extremities:  No clubbing cyanosis or edema     Neurologic exam:      Mental status:  Alert and oriented to person, place and time.  Attention is intact. Some difficulty with repetition (although limited by language barrier).  Recent memory intact, recalled 3/3 words immediately and 1/3 words at 5 minutes and 3/3 at 5 minutes with cueing.   Mild word finding difficulties. Naming intact to telephone, pen, paper and computer mouse. Able to follow 3 step commands that cross midline.    Cranial nerves: II: Visual field full   III, IV, VI: extraocular muscles motions intact  V: facial light touch sensation normal bilaterally  VII: facial muscle function normal bilaterally  VIII: hearing normal  IX: soft palate elevation  normal in midline  IX, X: gag reflex deferred  XI: trapezius sternocleidomastoid strength normal bilaterally  XII: tongue strength deferred   Sensory:  normal to light touch (decreased sensation to left middle finger)   Motor: Grossly non-focal and antigravity x4   Coordination: Intact FTN bilaterally   Reflexes: deferred   Gait:  Steady, narrow based gait with cane. Able to walk forwards and backwards.     KPS: 80 (nonfluent aphasia and short term memory difficulty)    Imaging Studies:    MRI brain w/wo contrast from 04/15/24 was independently reviewed by attending physician, Dr. Windsor which demonstrates no evidence of tumor recurrence. Agree with impression below.        IMPRESSION:    Postsurgical changes related to left temporal craniotomy with resection of an residual encephalomalacia of the left temporal lobe.      No abnormal enhancement.  Exam End: 04/15/24 12:50 Last Resulted: 04/15/24 14:00   Received From: [facility]  Result Received: 04/17/24 07:48       Pathology:  No new pathology    Recent Labs:  Labs completed @ [facility]  Reminded patient to get labs with each MRI.         Assessment:   [name] is a 78 y.o. female.  This patient has a newly diagnosed Glioblastoma, WHO Grade 4, unmethylated, EGFR amp, located in the left Temporal lobe.  In summary, the patient treatments include craniotomy with GTR. She completed 6 cycles of Adjuvant TMZ 02/16/23 - 07/16/23 and is now on MRI surveillance    Since last visit, patient is stable clinically overall with no new complaints. Stable nonfluent aphasia and left finger numbness is stable. Denies any pain or weakness. She is followed by outside neurology and declined EMG testing. Denies headaches. Stable word-finding difficulty. Ambulates independently at home and fully active in ADLs and takes care of husband. Denies seizures, currently not on AEDs. Advised follow up with local neurologist.     Neuro examination is stable with nonfluent aphasia and left fingers numbness. Reminded patient she should get labs every 8 weeks with MRI.     MRI brain w/wo contrast from 04/15/24 was independently reviewed by attending physician, Dr. Windsor which demonstrates no evidence of tumor recurrence.     Discussed that we will continue with surveillance at this time with MRI q8weeks until 1/5-2 years after diagnosis and then can consider increasing interval between scans. Plan for repeat labs prior to MRI.    She will follow up with local neurologist regarding numbness in left hand (likely peripheral) as well as titration of AEDs.    All patient questions answered today and they are in agreement with plan going forward.  The patient has our contact information and has been encouraged to call with any new symptom or problems, questions, concerns, or if  there are any changes in his neurological or functional status.    Plans/Recommendations:   #Glioblastoma  - MRI in 8 weeks with VV    - TBD at [facility]   - Daughter, Ronnald, will email team for an upload link.   - Labs prior to each MRI  - Continue Prozac  20mg  every day     #Seizures  - F/u with outside neurologist for management   -Advised to keep seizure diary   -Educated on seizures S/S    #Left arm/hand neuropathy  - topical lidocaine  PRN  - Follow up with general neurology    RTC (telemedicine) in 8 weeks with MRI brain at [facility])    Future Appointments (if any - see below for last 10 appointments)  Future Appointments   Date Time Provider Department Center   04/18/2024 11:30 AM Windsor Lamar LABOR., MD, PhD [facility] [place]       Attending Physician: Lamar LABOR. Windsor, MD, PhD.  Author: Steva MICAEL Reaper, NP    20 minutes were spent personally by me today on this encounter which include today's pre-visit review of the chart, obtaining appropriate history, performing an evaluation, documentation and discussion of management with details supported within the note for today's visit. The time documented was exclusive of any time spent on the separately billed procedure.    ADDENDUM   20 minutes were spent personally by me today on this encounter which include today's pre-visit review of the chart, obtaining appropriate history, performing an evaluation, documentation and discussion of management with details supported within the note for today's visit. The time documented was exclusive of any time spent on the separately billed procedure.    I performed a substantive portion of an E/M visit face-to-face with patient [name] on the same date of service with Steva Reaper, NP. I was personally involved in reviewing and conducting elements of the history, physical exam, adverse events, laboratory studies, and medical decision making, and agree with the above findings and plan of care documented in the NP's note along with my additions and/or corrections.      Kenzly Rogoff A. Windsor, MD, PhD   [phone]

## 2024-05-06 ENCOUNTER — Other Ambulatory Visit: Payer: PRIVATE HEALTH INSURANCE

## 2024-06-13 ENCOUNTER — Ambulatory Visit: Payer: PRIVATE HEALTH INSURANCE | Attending: Neurology

## 2024-06-19 ENCOUNTER — Inpatient Hospital Stay: Payer: PRIVATE HEALTH INSURANCE

## 2024-06-19 NOTE — Progress Notes
 Patient Consent to Telehealth Questionnaire   Patient agrees to the following.  - I agree  to be treated via a video visit and acknowledge that I may be liable for any relevant copays or coinsurance depending on my insurance plan.  - I understand that this video visit is offered for my convenience and I am able to cancel and reschedule for an in-person appointment if I desire.  - I also acknowledge that sensitive medical information may be discussed during this video visit appointment and that it is my responsibility to locate myself in a location that ensures privacy to my own level of comfort.  - I also acknowledge that I should not be participating in a video visit in a way that could cause danger to myself or to those around me (such as driving or walking).  If my provider is concerned about my safety, I understand that they have the right to terminate the visit.     [facility] BRAIN TUMOR CENTER  [place] NEURO-ONCOLOGY FOLLOW-UP NOTE    DATE OF SERVICE: 06/20/2024    REFERRING PROVIDER: Dr. Pauline Blanch  PRIMARY CARE PROVIDER: Laurita Pointer, MD  PROVIDER: Lamar LABOR. Windsor, MD, PhD    REASON FOR FOLLOW-UP: Glioblastoma treatment, MRI review  Encounter Diagnosis   Name Primary?    Glioblastoma (HCC/RAF) Yes           HISTORY OF PRESENT ILLNESS      BRITANY CALLICOTT is a 78 y.o.  female, right-handed, who is seen for follow-up at the Neuro-Oncology Clinic at [facility] for glioblastoma treatment options and clinical review.      The patient presented to [facility] ER on 10/26/22 with word finding difficulties for 2 weeks when speaking, Portuguese (first language), English, and Spanish. CT head demonstrated a lesion in her left anterior temporal lobe and was transferred to [facility] for HLOC. She underwent left temporal craniotomy with GTR by Dr. Pauline Blanch on 11/09/22 that was complicated by an epidural hematoma that was evacuated on 11/14/22 by Dr. Pauline Blanch. Pathology demonstrated Glioblastoma, WHO Grade, 4, methylated, EGFR amp. 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     BRAIN TUMOR DIAGNOSIS    [x] GLIOMA [] MENINGIOMA [] METS[] OTHER[] unk    Neuropathology:  Glioblastoma WHO grade: IV, PIY:tpoiubez  Location: left anterior temporal  Recurrence?:  [x] New  [] Recurrent Molecular studies:  MGMT Gene Methylation: methylated  1p/19q codeletion by FISH: not done  EGFR amplification by FISH: positive  NGS:         BRAIN/NERVOUS SYSTEM TUMOR TREATMENT HISTORY SUMMARY  NEW DX    11/09/22 Left temporal craniotomy with gross total resection of left temporal lesion by Dr. Pauline Blanch at [facility]    Pathology at that time was consistent with Glioblastoma, WHO Grade 4   11/14/22 Left Craniotomy for epidural hematoma evacuation by Dr. Pauline Blanch at [facility]   12/21/22 - 01/11/23 Radiation therapy, type unknown, to left temporal lesion,  for dosage of 30Gy, performed by Dr. Rosalba Rundle   12/21/22 - 01/11/23 started Chemotherapy TMZ, concurrent with radiation therapy, dose at 75mg /m2 125 mg   02/16/23 - 07/16/23 Adjuvant TMZ  02/16/23 - Cycle 1 @ 150mg /m2 = 255mg  x5 days  03/16/23 - Cyce 2 @ 200mg /m2 = 340mg  x 5 days  04/13/23 - Cycle 3 @200mg /m2 = 340mg  x 5 days  05/11/23 - Cycle 4 @ 200 mg/m2 = 340  mg x 5 days  06/08/23 - Cycle 5 @200mg /m2 = 340mg  x 5 days  07/12/23 - Cycle 6 @200mg /m2 = 340mg  x 5 days   08/07/23  SURVEILLANCE                        NEURO-ONC RELATED ADVERSE EVENT HISTORY  SEIZURES - None  DVTs/PE - None  TREATMENT-RELATED COMPLICATIONS - None    INTERVAL HISTORY  Last seen:04/18/24    Patient presents with her daughter, Ronnald.   Following her last visit, patient has generally remained stable neurologically with stable short term memory difficulty and word finding difficulty.  She is has continued anxiety and was started on Prozac . She otherwise continues to have difficulty with balance, uses cane. Daughter notes patient had a mechanical fall about two weeks ago, though only with minor injury.Denies head trauma.   She is very independent, cooks, is active, and takes care of her husband who is unfortunately suffering from Alzheimer's  Denies seizures. Remains off AEDs and is followed by local neurologist for AED management.   Denies any other new focal weakness, paresthesias, seizures, or other neurologic symtpoms.      Medications  Current Outpatient Medications   Medication Sig    amLODIPine 10 mg tablet Take 1 tablet (10 mg total) by mouth daily.    Ascorbic Acid (VITAMIN C) 1000 MG tablet Take 1 tablet (1,000 mg total) by mouth daily. (Patient not taking: Reported on 06/09/2023)    Cholecalciferol (VITAMIN D-3) 125 mcg (5000 units) TABS Take 1 tablet (125 mcg total) by mouth daily. (Patient not taking: Reported on 06/09/2023)    docusate 100 mg capsule Take 1 capsule (100 mg total) by mouth two (2) times daily.    FLUoxetine  20 mg capsule Take 2 capsules (40 mg total) by mouth daily.    Glucosamine HCl (GLUCOSAMINE PO) Take 1 capsule by mouth daily.    levETIRAcetam  500 mg 24 hr tablet Take 2 tablets (1,000 mg total) by mouth daily.    ondansetron  (ZOFRAN ) 8 mg tablet Take 1 tablet (8 mg total) by mouth every eight (8) hours as needed for Nausea or Vomiting And Take 1 tab 1 hour before chemo.     No current facility-administered medications for this visit.     Allergies:  No Known Allergies    Review of Systems:  A 14-system review of systems was performed and is negative except as stated in the history of present illness.        Exams:      Vitals:    There were no vitals taken for this visit. Exam performed via telemedicine, vital signs not obtained.     General: alert, appears stated age   Skin: No rash   HEENT: Craniotomy scar well healed. Eyes clear sclera/conjunctiva.  No Oropharyngeal lesions   Lungs:  breathing comfortably on RA   Abdomen:  soft   Extremities:  No clubbing cyanosis or edema     Neurologic exam:      Mental status:  Alert and oriented to person, place and time.  Attention is intact.  Recent memory intact, recalled 3/3 words immediately and 1/3 words at 5 minutes and 2/3 at 5 minutes with cueing.   Mild word finding difficulties. Naming intact. Able to repeat. Able to follow 3 step commands that cross midline.    Cranial nerves: II: Visual field full   III, IV, VI: extraocular muscles motions intact  V: facial light touch  sensation normal bilaterally  VII: facial muscle function normal bilaterally  VIII: hearing normal  IX: soft palate elevation  normal in midline  IX, X: gag reflex deferred  XI: trapezius sternocleidomastoid strength normal bilaterally  XII: tongue strength deferred   Sensory:  normal to light touch (decreased sensation to left middle finger)   Motor: Grossly non-focal and antigravity x4   Coordination: Intact FTN bilaterally   Reflexes: deferred   Gait:  Steady, narrow based gait with cane. Able to walk forwards and backwards.     KPS: 80 (anomic aphasia and short term memory difficulty)    Imaging Studies:    MRI brain w/wo contrast from 06/19/24 was independently reviewed by attending physician, Dr. Windsor which demonstrates no evidence of tumor recurrence. Agree with impression below.        IMPRESSION:    Postsurgical changes related to left temporal craniotomy with resection of an residual encephalomalacia of the left temporal lobe.      No abnormal enhancement.  Exam End: 04/15/24 12:50 Last Resulted: 04/15/24 14:00   Received From: [facility]  Result Received: 04/17/24 07:48       Pathology:  No new pathology    Recent Labs:  Labs completed @ [facility]  Reminded patient to get labs with each MRI.         Assessment:   KIYLEE THORESON is a 78 y.o. female.  This patient has a newly diagnosed Glioblastoma, WHO Grade 4, unmethylated, EGFR amp, located in the left Temporal lobe.  In summary, the patient treatments include craniotomy with GTR. She completed 6 cycles of Adjuvant TMZ 02/16/23 - 07/16/23 and is now on MRI surveillance    Since last visit, patient is stable with short term memory difficulty and word finding difficulty.  She is has continued anxiety and was started on Prozac . She otherwise continues to have difficulty with balance, uses cane. Daughter notes patient had a mechanical fall about two weeks ago, though only with minor injury.Denies head trauma. She is otherwise very independent, cooks, is active, and takes care of her husband who is unfortunately suffering from Alzheimer's. Denies seizures. Remains off AEDs and is followed by local neurologist for AED management. Neuro exam stable with anomic aphasia and short term memory difficulty; KPS 80%    MRI brain w/wo contrast from 06/19/24 was independently reviewed which demonstrates no evidence of tumor recurrence with stable postsurgical changes    Discussed that we will continue with surveillance at this time with MRI q8weeks until 1/5-2 years after diagnosis and then can consider increasing interval between scans. Plan for repeat labs prior to MRI. Reminded patient she should get labs every 8 weeks with MRI.     All patient questions answered today and they are in agreement with plan going forward.  The patient has our contact information and has been encouraged to call with any new symptom or problems, questions, concerns, or if there are any changes in his neurological or functional status.    Plans/Recommendations:   #Glioblastoma  - MRI in 8 weeks with VV    - TBD at [facility]   - Daughter, Ronnald, will email team for an upload link.   - Labs prior to each MRI  - Continue Prozac  20mg  every day     #Seizures  - F/u with outside neurologist for management   -Advised to keep seizure diary   -Educated on seizures S/S and precautions    #Left arm/hand neuropathy  -  topical lidocaine  PRN  - Follow up with general neurology    RTC (telemedicine) in 8 weeks with MRI brain at [facility])    Future Appointments (if any - see below for last 10 appointments)  No future appointments.      Attending Physician: Lamar LABOR. Windsor, MD, PhD.  Author: Sani Gandhi, DO    I, Sani Gandhi, DO, personally saw and examined the patient and made the above plan of care in conjunction with Dr. Windsor . I reviewed the above documentation and it is both accurate and complete.    Sani Gandhi, D.O.  Clinical Instructor/Fellow, [facility] Neuro-Oncology Program  Pager [phone]    ADDENDUM     I performed a substantive portion of an E/M visit face-to-face with patient XANIYAH BUCHHOLZ on the same date of service with Dr. Lucienne.  I was personally involved in reviewing and conducting elements of the history, physical exam, adverse events, laboratory studies, and medical decision making, and agree with the above findings and plan of care documented in the NP's note along with my additions and/or corrections.      Celester Lech A. Windsor, MD, PhD   [phone]

## 2024-06-20 ENCOUNTER — Ambulatory Visit: Payer: PRIVATE HEALTH INSURANCE | Attending: Neurology

## 2024-06-20 DIAGNOSIS — C719 Malignant neoplasm of brain, unspecified: Principal | ICD-10-CM

## 2024-08-19 ENCOUNTER — Ambulatory Visit: Payer: Commercial Managed Care - Pharmacy Benefit Manager | Attending: Neurology

## 2024-08-22 ENCOUNTER — Telehealth: Payer: PRIVATE HEALTH INSURANCE

## 2024-08-22 NOTE — Telephone Encounter
 Hello,    Please assist with obtaining auth for vRV appt on 08/29/24. Thank you!

## 2024-08-23 ENCOUNTER — Telehealth: Payer: PRIVATE HEALTH INSURANCE

## 2024-08-23 NOTE — Telephone Encounter
 I spoke to patient's daughter, appt with Dr. Windsor has been rescheduled for 02/26.

## 2024-08-23 NOTE — Telephone Encounter
 Reason/Request:  pt daughter is requesting a call back for assistance on rs pt missed mri     Caller Name and Relationship to PT:/Caregiver grace    Callback [phone]     Thanks Torie Priebe

## 2024-08-27 ENCOUNTER — Telehealth: Payer: PRIVATE HEALTH INSURANCE

## 2024-08-27 NOTE — Telephone Encounter
 I spoke to patient's daughter to reschedule the appt with Dr. Windsor.

## 2024-08-29 ENCOUNTER — Ambulatory Visit: Payer: PRIVATE HEALTH INSURANCE | Attending: Neurology

## 2024-09-02 ENCOUNTER — Inpatient Hospital Stay: Payer: PRIVATE HEALTH INSURANCE

## 2024-09-02 DIAGNOSIS — Z719 Counseling, unspecified: Secondary | ICD-10-CM

## 2024-09-05 ENCOUNTER — Ambulatory Visit: Payer: PRIVATE HEALTH INSURANCE | Attending: Neurology

## 2024-09-05 DIAGNOSIS — C719 Malignant neoplasm of brain, unspecified: Principal | ICD-10-CM

## 2024-09-05 NOTE — Progress Notes
 Patient Consent to Telehealth Questionnaire   Patient agrees to the following.  - I agree  to be treated via a video visit and acknowledge that I may be liable for any relevant copays or coinsurance depending on my insurance plan.  - I understand that this video visit is offered for my convenience and I am able to cancel and reschedule for an in-person appointment if I desire.  - I also acknowledge that sensitive medical information may be discussed during this video visit appointment and that it is my responsibility to locate myself in a location that ensures privacy to my own level of comfort.  - I also acknowledge that I should not be participating in a video visit in a way that could cause danger to myself or to those around me (such as driving or walking).  If my provider is concerned about my safety, I understand that they have the right to terminate the visit.     [facility] BRAIN [facility]UMOR [facility]  [name]    DATE OF SERVICE: 09/05/2024    REFERRING PROVIDER: Dr. Pauline Blanch  PRIMARY CARE PROVIDER: Laurita Pointer, MD  PROVIDER: Lamar LABOR. Windsor, MD, PhD    REASON FOR FOLLOW-UP: Glioblastoma treatment, MRI review  Encounter Diagnosis   Name Primary?    Glioblastoma (HCC/RAF) Yes             HISTORY OF PRESENT ILLNESS      [name] is a 79 y.o.  female, right-handed, who is seen for follow-up at the Neuro-Oncology Clinic at [facility] for glioblastoma treatment options and clinical review.      The patient presented to [facility] ER on 10/26/22 with word finding difficulties for 2 weeks when speaking, Portuguese (first language), English, and Spanish. CT head demonstrated a lesion in her left anterior temporal lobe and was transferred to [facility] for HLOC. She underwent left temporal craniotomy with GTR by Dr. Pauline Blanch on 11/09/22 that was complicated by an epidural hematoma that was evacuated on 11/14/22 by Dr. Pauline Blanch. Pathology demonstrated Glioblastoma, WHO Grade, 4, methylated, EGFR amp. 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     BRAIN TUMOR DIAGNOSIS    [x] GLIOMA [] MENINGIOMA [] METS[] OTHER[] unk    Neuropathology:  Glioblastoma WHO grade: IV, [id number]  Location: left anterior temporal  Recurrence?:  [x] New  [] Recurrent Molecular studies:  MGMT Gene Methylation: methylated  1p/19q codeletion by FISH: not done  EGFR amplification by FISH: positive  NGS:         BRAIN/NERVOUS SYSTEM TUMOR TREATMENT HISTORY SUMMARY  NEW DX    11/09/22 Left temporal craniotomy with gross total resection of left temporal lesion by Dr. Pauline Blanch at [facility]    Pathology at that time was consistent with Glioblastoma, WHO Grade 4   11/14/22 Left Craniotomy for epidural hematoma evacuation by Dr. Pauline Blanch at [facility]   12/21/22 - 01/11/23 Radiation therapy, type unknown, to left temporal lesion,  for dosage of 30Gy, performed by Dr. Rosalba Rundle   12/21/22 - 01/11/23 started Chemotherapy TMZ, concurrent with radiation therapy, dose at 75mg /m2 125 mg   02/16/23 - 07/16/23 Adjuvant TMZ  02/16/23 - Cycle 1 @ 150mg /m2 = 255mg  x5 days  03/16/23 - Cyce 2 @ 200mg /m2 = 340mg  x 5 days  04/13/23 - Cycle 3 @200mg /m2 = 340mg  x 5 days  05/11/23 - Cycle 4 @ 200 mg/m2 =  340 mg x 5 days  06/08/23 - Cycle 5 @200mg /m2 = 340mg  x 5 days  07/12/23 - Cycle 6 @200mg /m2 = 340mg  x 5 days   08/07/23  SURVEILLANCE                        NEURO-ONC RELATED ADVERSE EVENT HISTORY  SEIZURES - None  DVTs/PE - None  TREATMENT-RELATED COMPLICATIONS - None    INTERVAL HISTORY  Last seen:06/20/24    Patient presents with her daughter, [name]. Overall clinically stable since her last visit.  No headaches. Eating well. Reports mild intermittent low back pain, radiates down both legs. Denies saddle anesthesia, numbness, weakness or incontinence. No alarm symptoms.  Reports she has had on/off back pain in the past, pain is relieved by 1 dose of aleve. States pain typically cycles, increases just prior to MRIs and then resolves just after MRIs. She did have 2 mechanical falls since last visit, no head trauma.     She continues with anxiety and started Prozac  since last visit.     She is very independent, cooks, is active, and takes care of her husband who is unfortunately suffering from Alzheimer's  Denies seizures. Remains off AEDs and is followed by local neurologist for AED management.   Denies any other new focal weakness, paresthesias, seizures, or other neurologic symtpoms.      Medications  Current Outpatient Medications   Medication Sig    amLODIPine 10 mg tablet Take 1 tablet (10 mg total) by mouth daily.    Ascorbic Acid (VITAMIN C) 1000 MG tablet Take 1 tablet (1,000 mg total) by mouth daily. (Patient not taking: Reported on 06/09/2023)    Cholecalciferol (VITAMIN D-3) 125 mcg (5000 units) TABS Take 1 tablet (125 mcg total) by mouth daily. (Patient not taking: Reported on 06/09/2023)    docusate 100 mg capsule Take 1 capsule (100 mg total) by mouth two (2) times daily.    FLUoxetine  20 mg capsule Take 2 capsules (40 mg total) by mouth daily.    Glucosamine HCl (GLUCOSAMINE PO) Take 1 capsule by mouth daily.    ondansetron  (ZOFRAN ) 8 mg tablet Take 1 tablet (8 mg total) by mouth every eight (8) hours as needed for Nausea or Vomiting And Take 1 tab 1 hour before chemo.     No current facility-administered medications for this visit.     Allergies:  No Known Allergies    Review of Systems:  A 14-system review of systems was performed and is negative except as stated in the history of present illness.        Exams:      Vitals:    There were no vitals taken for this visit. Exam performed via telemedicine, vital signs not obtained.     General: alert, appears stated age   Skin: No rash   HEENT: Craniotomy scar well healed. Eyes clear sclera/conjunctiva.  No Oropharyngeal lesions   Lungs:  breathing comfortably on RA   Abdomen:  soft   Extremities:  No clubbing cyanosis or edema     Neurologic exam:      Mental status:  Alert and oriented to person, place and time.  Attention is intact.  Recent memory intact, recalled 3/3 words immediately and 1/3 words at 5 minutes and 2/3 at 5 minutes with cueing.   Mild word finding difficulties. Naming intact. Able to repeat. Able to follow 3 step commands that cross midline.    Cranial nerves: II: Visual  field full   III, IV, VI: extraocular muscles motions intact  V: facial light touch sensation normal bilaterally  VII: facial muscle function normal bilaterally  VIII: hearing normal  IX: soft palate elevation  normal in midline  IX, X: gag reflex deferred  XI: trapezius sternocleidomastoid strength normal bilaterally  XII: tongue strength deferred   Sensory:  normal to light touch (decreased sensation to left middle finger)   Motor: Grossly non-focal and antigravity x4   Coordination: Intact FTN bilaterally   Reflexes: deferred   Gait:  Steady, narrow based gait with cane. Able to walk forwards and backwards.     KPS: 80 (anomic aphasia and short term memory difficulty)    Imaging Studies:    MRI brain w/wo contrast from 08/27/24 was independently reviewed by attending physician, Dr. Windsor which demonstrates no evidence of tumor progression.      Imaging Results - MRI brain with and without contrast (08/27/2024 1:59 PM PST)  Procedure Note   Rosia Blunt, MD - 08/30/2024   Formatting of this note might be different from the original.  Examination: MRI BRAIN W WO CONTRAST    Clinical History: Brain/CNS neoplasm, surveillance  brain tumor.    Comparison: 06/17/24.    Technique: Multiplanar multisequence MRI of the brain was performed with and without intravenous contrast.    Findings:    Parenchyma: Scattered T2 hyperintensities in the periventricular and subcortical white matter with prominence of the sulci. Encephalomalacia along the left frontotemporal lobe  Ventricles: Prominence of the ventricles consistent with age related atrophy. Ex-vacuo dilation of the left lateral ventricle  Extra-axial: Unremarkable.  Facial/skull structures: Postsurgical changes related to prior left sided craniotomy.  Aerated spaces: Unremarkable.  Soft tissues: Unremarkable.    IMPRESSION:  Motion artifact.    No significant interval change. Stable postsurgical changes related to left sided craniotomy with residual encephalomalacia; unchanged from prior.           Imaging Results - MRI brain with and without contrast (08/27/2024 1:59 PM PST)  Authorizing Provider Result Type Result Status   Landy DELENA Mana MD IMG MRI ORDERABLES Final Result         Pathology:  No new pathology    Recent Labs:  Labs completed @ [facility]  Reminded patient to get labs with each MRI.         Assessment:   [name] is a 79 y.o. female.  This patient has a newly diagnosed Glioblastoma, WHO Grade 4, unmethylated, EGFR amp, located in the left Temporal lobe.  In summary, the patient treatments include craniotomy with GTR. She completed 6 cycles of Adjuvant TMZ 02/16/23 - 07/16/23 and is now on MRI surveillance    Since last visit, patient is stable with short term memory difficulty and word finding difficulty.  Intermittent chronic back pain, no alarm symptoms. She is has continued anxiety and was started on Prozac . She otherwise continues to have difficulty with balance, uses cane. She did have 2 mechanical falls since last visit, no head trauma.  She is otherwise very independent, cooks, is active, and takes care of her husband who is unfortunately suffering from Alzheimer's. Denies seizures. Remains off AEDs and is followed by local neurologist for AED management. Neuro exam stable with anomic aphasia and short term memory difficulty; KPS 80%    MRI brain w/wo contrast from 08/27/24 was independently reviewed which demonstrates no evidence of tumor recurrence with stable postsurgical changes    Discussed that we will  continue with surveillance at this time with MRI q8weeks until 1/5-2 years after diagnosis and then can consider increasing interval between scans. Plan for repeat labs prior to MRI. Reminded patient she should get labs every 8 weeks with MRI.     All patient questions answered today and they are in agreement with plan going forward.  The patient has our contact information and has been encouraged to call with any new symptom or problems, questions, concerns, or if there are any changes in his neurological or functional status.    Plans/Recommendations:   #Glioblastoma  - MRI in 8 weeks at [facility] (Send order to PCP, Greig Mana at [facility], phone [phone]; fax [phone]   - Daughter, [name], will email team for an upload link.   - Labs prior to each MRI  - Continue Prozac  20mg  every day     #Seizures  - F/u with outside neurologist for management   -Advised to keep seizure diary   -Educated on seizures S/S and precautions    #Left arm/hand neuropathy  - topical lidocaine  PRN  - Follow up with general neurology    RTC (telemedicine) in 8 weeks with MRI brain at [facility]    Future Appointments (if any - see below for last 10 appointments)  Future Appointments   Date Time Provider Department Center   09/05/2024 11:30 AM Windsor Lamar LABOR., MD, PhD [facility] [place]/Cen         Attending Physician: Lamar LABOR. Windsor, MD, PhD.  Author: Steva MICAEL Reaper, NP      This patient was seen in collaboration with attending physician, Dr. Lamar Windsor.     20 minutes were spent personally by me, Steva Reaper, NP, today on this encounter which include today's pre-visit review of the chart, obtaining appropriate history, performing an evaluation, documentation and discussion of management with details supported within the note for today's visit. The time documented was exclusive of any time spent on the separately billed procedure.
